# Patient Record
Sex: Female | Born: 1985 | Race: White | Hispanic: No | Marital: Married | Smoking: Former smoker
Health system: Southern US, Community
[De-identification: ages and names within clinical notes are randomized; demographics above are authoritative.]

## PROBLEM LIST (undated history)

## (undated) ENCOUNTER — Inpatient Hospital Stay (HOSPITAL_COMMUNITY): Payer: Self-pay

## (undated) ENCOUNTER — Inpatient Hospital Stay: Payer: Self-pay

## (undated) DIAGNOSIS — Z789 Other specified health status: Secondary | ICD-10-CM

## (undated) DIAGNOSIS — N920 Excessive and frequent menstruation with regular cycle: Secondary | ICD-10-CM

## (undated) DIAGNOSIS — O139 Gestational [pregnancy-induced] hypertension without significant proteinuria, unspecified trimester: Secondary | ICD-10-CM

## (undated) DIAGNOSIS — F32A Depression, unspecified: Secondary | ICD-10-CM

## (undated) DIAGNOSIS — R519 Headache, unspecified: Secondary | ICD-10-CM

## (undated) DIAGNOSIS — F419 Anxiety disorder, unspecified: Secondary | ICD-10-CM

## (undated) DIAGNOSIS — G43909 Migraine, unspecified, not intractable, without status migrainosus: Secondary | ICD-10-CM

## (undated) DIAGNOSIS — O209 Hemorrhage in early pregnancy, unspecified: Secondary | ICD-10-CM

## (undated) DIAGNOSIS — K649 Unspecified hemorrhoids: Secondary | ICD-10-CM

## (undated) DIAGNOSIS — R51 Headache: Secondary | ICD-10-CM

## (undated) DIAGNOSIS — R87629 Unspecified abnormal cytological findings in specimens from vagina: Secondary | ICD-10-CM

## (undated) HISTORY — DX: Depression, unspecified: F32.A

## (undated) HISTORY — PX: NO PAST SURGERIES: SHX2092

## (undated) HISTORY — DX: Headache, unspecified: R51.9

## (undated) HISTORY — DX: Anxiety disorder, unspecified: F41.9

## (undated) HISTORY — DX: Migraine, unspecified, not intractable, without status migrainosus: G43.909

## (undated) HISTORY — DX: Unspecified hemorrhoids: K64.9

## (undated) HISTORY — DX: Gestational (pregnancy-induced) hypertension without significant proteinuria, unspecified trimester: O13.9

## (undated) HISTORY — DX: Unspecified abnormal cytological findings in specimens from vagina: R87.629

## (undated) HISTORY — DX: Headache: R51

---
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
## 2011-08-15 NOTE — L&D Delivery Note (Signed)
Delivery Note At 6:36 AM a viable female was delivered via Vaginal, Spontaneous Delivery (Presentation: ; Occiput Anterior).  APGAR: 9, 9; weight .   Placenta status: Intact, w/ maternal valsalva and after pitocin infusion. Spontaneous.  Cord: 3 vessels with the following complications: None.  Cord pH: pending, drawn due to blood tinged amniotic fluid and hypertension, ? Mild abruption  Anesthesia: Epidural  Episiotomy: None Lacerations: 2nd degree;Perineal Suture Repair: 2.0 3.0 vicryl rapide, deeper layer of 2-0, 3 single figure of 8 sutures. Standard repair followed w/ 3.0. Est. Blood Loss (mL): 350  Mom to postpartum.  Baby to nursery-stable.  Gini Caputo,[place] A. 06/27/2012, 7:14 AM

## 2011-10-06 LAB — OB RESULTS CONSOLE GC/CHLAMYDIA: Gonorrhea: NEGATIVE

## 2011-11-16 LAB — OB RESULTS CONSOLE ABO/RH

## 2011-11-16 LAB — OB RESULTS CONSOLE ANTIBODY SCREEN: Antibody Screen: NEGATIVE

## 2012-05-20 LAB — OB RESULTS CONSOLE GBS: GBS: NEGATIVE

## 2012-06-26 ENCOUNTER — Encounter (HOSPITAL_COMMUNITY): Payer: Self-pay | Admitting: *Deleted

## 2012-06-26 ENCOUNTER — Inpatient Hospital Stay (HOSPITAL_COMMUNITY)
Admission: AD | Admit: 2012-06-26 | Discharge: 2012-06-29 | DRG: 372 | Disposition: A | Discharge status: Home / Self Care | Payer: BC Managed Care – PPO | Source: Ambulatory Visit | Attending: Obstetrics | Admitting: Obstetrics

## 2012-06-26 DIAGNOSIS — O139 Gestational [pregnancy-induced] hypertension without significant proteinuria, unspecified trimester: Secondary | ICD-10-CM | POA: Diagnosis present

## 2012-06-26 DIAGNOSIS — D62 Acute posthemorrhagic anemia: Secondary | ICD-10-CM | POA: Diagnosis not present

## 2012-06-26 DIAGNOSIS — O9903 Anemia complicating the puerperium: Secondary | ICD-10-CM | POA: Diagnosis not present

## 2012-06-26 HISTORY — DX: Other specified health status: Z78.9

## 2012-06-26 LAB — CBC WITH DIFFERENTIAL/PLATELET
Basophils Absolute: 0 10*3/uL (ref 0.0–0.1)
Basophils Relative: 0 % (ref 0–1)
HCT: 33.5 % — ABNORMAL LOW (ref 36.0–46.0)
Hemoglobin: 11.2 g/dL — ABNORMAL LOW (ref 12.0–15.0)
Lymphocytes Relative: 23 % (ref 12–46)
MCHC: 33.4 g/dL (ref 30.0–36.0)
Monocytes Absolute: 0.6 10*3/uL (ref 0.1–1.0)
Monocytes Relative: 5 % (ref 3–12)
Neutro Abs: 8.1 10*3/uL — ABNORMAL HIGH (ref 1.7–7.7)
Neutrophils Relative %: 72 % (ref 43–77)
WBC: 11.3 10*3/uL — ABNORMAL HIGH (ref 4.0–10.5)

## 2012-06-26 LAB — COMPREHENSIVE METABOLIC PANEL
ALT: 16 U/L (ref 0–35)
AST: 24 U/L (ref 0–37)
Albumin: 2.4 g/dL — ABNORMAL LOW (ref 3.5–5.2)
Alkaline Phosphatase: 155 U/L — ABNORMAL HIGH (ref 39–117)
Chloride: 102 mEq/L (ref 96–112)
Potassium: 4.1 mEq/L (ref 3.5–5.1)
Sodium: 135 mEq/L (ref 135–145)
Total Bilirubin: 0.2 mg/dL — ABNORMAL LOW (ref 0.3–1.2)

## 2012-06-26 LAB — CBC
HCT: 35.7 % — ABNORMAL LOW (ref 36.0–46.0)
Hemoglobin: 11.8 g/dL — ABNORMAL LOW (ref 12.0–15.0)
RBC: 4.23 MIL/uL (ref 3.87–5.11)

## 2012-06-26 LAB — URIC ACID: Uric Acid, Serum: 7.5 mg/dL — ABNORMAL HIGH (ref 2.4–7.0)

## 2012-06-26 MED ORDER — OXYTOCIN 40 UNITS IN LACTATED RINGERS INFUSION - SIMPLE MED
1.0000 m[IU]/min | INTRAVENOUS | Status: DC
  Administered 2012-06-26: 2 m[IU]/min via INTRAVENOUS
  Administered 2012-06-26: 4 m[IU]/min via INTRAVENOUS
  Administered 2012-06-27 (×2): 6 m[IU]/min via INTRAVENOUS
  Filled 2012-06-26: qty 1000

## 2012-06-26 MED ORDER — IBUPROFEN 600 MG PO TABS
600.0000 mg | ORAL_TABLET | Freq: Four times a day (QID) | ORAL | Status: DC | PRN
  Administered 2012-06-27: 600 mg via ORAL
  Filled 2012-06-26: qty 1

## 2012-06-26 MED ORDER — LACTATED RINGERS IV SOLN
500.0000 mL | INTRAVENOUS | Status: DC | PRN
  Administered 2012-06-27: 1000 mL via INTRAVENOUS

## 2012-06-26 MED ORDER — LABETALOL HCL 100 MG PO TABS
100.0000 mg | ORAL_TABLET | Freq: Once | ORAL | Status: AC
  Administered 2012-06-26: 100 mg via ORAL
  Filled 2012-06-26: qty 1

## 2012-06-26 MED ORDER — OXYTOCIN BOLUS FROM INFUSION: 500.0000 mL | INTRAVENOUS | Status: DC

## 2012-06-26 MED ORDER — LIDOCAINE HCL (PF) 1 % IJ SOLN
30.0000 mL | INTRAMUSCULAR | Status: DC | PRN
  Administered 2012-06-27: 30 mL via SUBCUTANEOUS
  Filled 2012-06-26: qty 30

## 2012-06-26 MED ORDER — MISOPROSTOL 25 MCG QUARTER TABLET
25.0000 ug | ORAL_TABLET | Freq: Once | ORAL | Status: AC
  Administered 2012-06-26: 25 ug via VAGINAL
  Filled 2012-06-26: qty 0.25

## 2012-06-26 MED ORDER — TERBUTALINE SULFATE 1 MG/ML IJ SOLN: 0.2500 mg | Freq: Once | INTRAMUSCULAR | Status: AC | PRN

## 2012-06-26 MED ORDER — LABETALOL HCL 5 MG/ML IV SOLN
10.0000 mg | Freq: Once | INTRAVENOUS | Status: AC
  Administered 2012-06-26: 10 mg via INTRAVENOUS
  Filled 2012-06-26: qty 4

## 2012-06-26 MED ORDER — LACTATED RINGERS IV SOLN
INTRAVENOUS | Status: DC
  Administered 2012-06-26: 15:00:00 via INTRAVENOUS

## 2012-06-26 MED ORDER — FLEET ENEMA 7-19 GM/118ML RE ENEM: 1.0000 | ENEMA | Freq: Every day | RECTAL | Status: DC | PRN

## 2012-06-26 MED ORDER — ONDANSETRON HCL 4 MG/2ML IJ SOLN: 4.0000 mg | Freq: Four times a day (QID) | INTRAMUSCULAR | Status: DC | PRN

## 2012-06-26 MED ORDER — ACETAMINOPHEN 325 MG PO TABS: 650.0000 mg | ORAL_TABLET | ORAL | Status: DC | PRN

## 2012-06-26 MED ORDER — OXYCODONE-ACETAMINOPHEN 5-325 MG PO TABS: 1.0000 | ORAL_TABLET | ORAL | Status: DC | PRN

## 2012-06-26 MED ORDER — OXYTOCIN 40 UNITS IN LACTATED RINGERS INFUSION - SIMPLE MED
62.5000 mL/h | INTRAVENOUS | Status: DC
  Administered 2012-06-27: 62.5 mL/h via INTRAVENOUS

## 2012-06-26 MED ORDER — CITRIC ACID-SODIUM CITRATE 334-500 MG/5ML PO SOLN: 30.0000 mL | ORAL | Status: DC | PRN

## 2012-06-26 NOTE — Progress Notes (Signed)
Late entry. Pt seen at 9:30 pm  Notes no HA/ scotomata but did have some earlier when bp elevated. Received 10mg  IV and 100mg  po labetalol w/ improvement in bp. No VB. Some cramping but no significant contractions.  O: Toco: none, some irritibility GU: foley in, at distal edge of cvx, close to coming out. cvx 3 cm, soft, 20% FH: reactive FH, no decels, 10 beat variability Gen: well appearing, no distress, no RUQ pain  A/P: Gest htn, IOL - gest htn, repeat another 100mg  po labetalol, may need to cont 200mg  po q 8hrs. Watch for acute elevations and treat w/ IV push as needed. Repeat PIH labs at 11p (results pending now) - FWB. Reactive testing - IOL. Slow but appropriate progress. Start pitocin when finished w/ 2nd cytotec, pt now ripe.   Mazzie Brodrick,[place] A. 06/26/2012 11:46 PM

## 2012-06-26 NOTE — H&P (Signed)
Jacqueline Barber is a 26 y.o. G1P0000 at [redacted]w[redacted]d presenting for a labor due to gestational hypertension. Pt notes rare contractions  . Good fetal movement, No vaginal bleeding, not leaking fluid. Patient notes increased thirst however no scotomata, no right upper quadrant pain, no headache. Patient seen in the office yesterday with 500 mg her urine on urine protein tip in was in the process of a 24-hour urine collection when patient asked for induction of labor due to feeling not quite right. Patient did have labs for preeclampsia drawn yesterday which were notable for a uric acid at 7.2. CBC creatinine and liver function tests were all within normal limits.  [facility] first trimester GBS negative -Prior TCA to cervix, no LEEP, followup Paps normal - Gestational hypertension. Elevated blood pressures noted at 33 weeks patient had labs which remained reassuring in overall exam status reassuring. -Status post Tdap  Prenatal Transfer Tool  Maternal Diabetes: No Genetic Screening: Normal Maternal Ultrasounds/Referrals: Normal Fetal Ultrasounds or other Referrals:  None Maternal Substance Abuse:  No Significant Maternal Medications:  Meds include: Other:  Zantac, Valtrex, prenatal vitamin, Fioricet  Significant Maternal Lab Results: Lab values include: Group B Strep negative, elevated uric acid     OB History    Grav Para Term Preterm Abortions TAB SAB Ect Mult Living   1 0 0 0 0 0 0 0 0 0      Past Medical History  Diagnosis Date  . No pertinent past medical history    History reviewed. No pertinent past surgical history. Family History: family history includes Cancer in her brother; Diabetes in her maternal grandfather; Hyperlipidemia in her father; and Hypertension in her father and mother. Social History:  reports that she has never smoked. She does not have any smokeless tobacco history on file. She reports that she does not drink alcohol or use illicit  drugs.  Review of Systems - Negative except Anxiety due to elevated blood pressure   Dilation: Closed Effacement (%): Thick Station: -3 Exam by:: J.Thornton, RN Blood pressure 142/82, pulse 89, temperature 98 F (36.7 C), temperature source Oral, resp. rate 18, height 5\' 2"  (1.575 m), weight 83.915 kg (185 lb), last menstrual period 09/16/2011.  Physical Exam:  Gen: well appearing, no distress, slightly anxious CV: RRR Pulm: CTAB Back: no CVAT Abd: gravid, NT, no RUQ pain LE: No  edema, equal bilaterally, non-tender Toco: Rare contractions and irritability  FH: baseline 150s, accelerations present, no deceleratons, 10 beat variability  Cervical exam by me fingertip long posterior vertex. Decision made to place a cervical Foley bulb. Informed consent obtained. The patient placed in the dorsal supine lithotomy position, sterile speculum was inserted into the vagina, Betadine x3 was used to cleanse the cervix. A Foley catheter was threaded through the cervix with the use of ring forceps. Once at the internal os the Foley bulb was inflated with 60 cc of normal saline. Small amount of bleeding was noted. Foley bulb placed on tension. Patient tolerated the procedure well.   Prenatal labs: ABO, Rh: A/Positive/-- (04/04 0000) Antibody: Negative (04/04 0000) Rubella:  Immune RPR: Nonreactive (04/04 0000)  HBsAg: Negative (04/04 0000)  HIV:   Negative  GBS: Negative (10/07 0000)  1 hr Glucola 73  Genetic screening normal  Anatomy US normal   Assessment/Plan: 26 y.o. G1P0000 at [redacted]w[redacted]d - Gestational hypertension. Repeat labs for preeclampsia R.  significant for a uric acid at 7.5 otherwise labs and exam are normal. Given past EDC and  elevated blood pressures will move towards delivery with induction of labor, - Induction of labor. Cytotec and cervical Foley bulb now. We'll plan Pitocin when patient more favorable - Pain. Planning epidural - Fetal well being. Reactive fetal  testing   Jacqueline Barber,[place] A. 06/26/2012, 4:07 PM

## 2012-06-27 ENCOUNTER — Encounter (HOSPITAL_COMMUNITY): Payer: Self-pay | Admitting: Anesthesiology

## 2012-06-27 ENCOUNTER — Encounter (HOSPITAL_COMMUNITY): Payer: Self-pay

## 2012-06-27 ENCOUNTER — Observation Stay (HOSPITAL_COMMUNITY): Payer: BC Managed Care – PPO | Admitting: Anesthesiology

## 2012-06-27 LAB — CBC
MCV: 84 fL (ref 78.0–100.0)
Platelets: 166 10*3/uL (ref 150–400)
RBC: 3.99 MIL/uL (ref 3.87–5.11)
WBC: 16.1 10*3/uL — ABNORMAL HIGH (ref 4.0–10.5)

## 2012-06-27 LAB — COMPREHENSIVE METABOLIC PANEL
AST: 23 U/L (ref 0–37)
Albumin: 2.1 g/dL — ABNORMAL LOW (ref 3.5–5.2)
Alkaline Phosphatase: 137 U/L — ABNORMAL HIGH (ref 39–117)
CO2: 19 mEq/L (ref 19–32)
Chloride: 102 mEq/L (ref 96–112)
Creatinine, Ser: 0.66 mg/dL (ref 0.50–1.10)
GFR calc non Af Amer: >90 mL/min (ref >90)
Potassium: 3.9 mEq/L (ref 3.5–5.1)
Total Bilirubin: 0.1 mg/dL — ABNORMAL LOW (ref 0.3–1.2)

## 2012-06-27 LAB — RPR: RPR Ser Ql: NONREACTIVE

## 2012-06-27 MED ORDER — SIMETHICONE 80 MG PO CHEW: 80.0000 mg | CHEWABLE_TABLET | ORAL | Status: DC | PRN

## 2012-06-27 MED ORDER — PHENYLEPHRINE 40 MCG/ML (10ML) SYRINGE FOR IV PUSH (FOR BLOOD PRESSURE SUPPORT)
80.0000 ug | PREFILLED_SYRINGE | INTRAVENOUS | Status: DC | PRN
  Filled 2012-06-27: qty 5

## 2012-06-27 MED ORDER — FENTANYL 2.5 MCG/ML BUPIVACAINE 1/10 % EPIDURAL INFUSION (WH - ANES)
INTRAMUSCULAR | Status: DC | PRN
  Administered 2012-06-27: 14 mL/h via EPIDURAL

## 2012-06-27 MED ORDER — IBUPROFEN 600 MG PO TABS
600.0000 mg | ORAL_TABLET | Freq: Four times a day (QID) | ORAL | Status: DC
  Administered 2012-06-27 – 2012-06-29 (×8): 600 mg via ORAL
  Filled 2012-06-27 (×8): qty 1

## 2012-06-27 MED ORDER — LANOLIN HYDROUS EX OINT: TOPICAL_OINTMENT | CUTANEOUS | Status: DC | PRN

## 2012-06-27 MED ORDER — DIPHENHYDRAMINE HCL 50 MG/ML IJ SOLN: 12.5000 mg | INTRAMUSCULAR | Status: DC | PRN

## 2012-06-27 MED ORDER — BISACODYL 10 MG RE SUPP: 10.0000 mg | Freq: Every day | RECTAL | Status: DC | PRN

## 2012-06-27 MED ORDER — PHENYLEPHRINE 40 MCG/ML (10ML) SYRINGE FOR IV PUSH (FOR BLOOD PRESSURE SUPPORT): 80.0000 ug | PREFILLED_SYRINGE | INTRAVENOUS | Status: DC | PRN

## 2012-06-27 MED ORDER — PRENATAL MULTIVITAMIN CH
1.0000 | ORAL_TABLET | Freq: Every day | ORAL | Status: DC
  Administered 2012-06-27 – 2012-06-29 (×3): 1 via ORAL
  Filled 2012-06-27 (×3): qty 1

## 2012-06-27 MED ORDER — BENZOCAINE-MENTHOL 20-0.5 % EX AERO
1.0000 "application " | INHALATION_SPRAY | CUTANEOUS | Status: DC | PRN
  Administered 2012-06-27: 1 via TOPICAL
  Filled 2012-06-27: qty 56

## 2012-06-27 MED ORDER — ONDANSETRON HCL 4 MG/2ML IJ SOLN: 4.0000 mg | INTRAMUSCULAR | Status: DC | PRN

## 2012-06-27 MED ORDER — ZOLPIDEM TARTRATE 5 MG PO TABS: 5.0000 mg | ORAL_TABLET | Freq: Every evening | ORAL | Status: DC | PRN

## 2012-06-27 MED ORDER — TETANUS-DIPHTH-ACELL PERTUSSIS 5-2.5-18.5 LF-MCG/0.5 IM SUSP: 0.5000 mL | Freq: Once | INTRAMUSCULAR | Status: DC

## 2012-06-27 MED ORDER — DOCUSATE SODIUM 100 MG PO CAPS
100.0000 mg | ORAL_CAPSULE | Freq: Two times a day (BID) | ORAL | Status: DC
  Administered 2012-06-27 – 2012-06-29 (×5): 100 mg via ORAL
  Filled 2012-06-27 (×5): qty 1

## 2012-06-27 MED ORDER — ONDANSETRON HCL 4 MG PO TABS: 4.0000 mg | ORAL_TABLET | ORAL | Status: DC | PRN

## 2012-06-27 MED ORDER — EPHEDRINE 5 MG/ML INJ: 10.0000 mg | INTRAVENOUS | Status: DC | PRN

## 2012-06-27 MED ORDER — SODIUM BICARBONATE 8.4 % IV SOLN
INTRAVENOUS | Status: DC | PRN
  Administered 2012-06-27: 5 mL via EPIDURAL

## 2012-06-27 MED ORDER — SENNOSIDES-DOCUSATE SODIUM 8.6-50 MG PO TABS
2.0000 | ORAL_TABLET | Freq: Every day | ORAL | Status: DC
  Administered 2012-06-27 – 2012-06-28 (×2): 2 via ORAL

## 2012-06-27 MED ORDER — FLEET ENEMA 7-19 GM/118ML RE ENEM: 1.0000 | ENEMA | Freq: Every day | RECTAL | Status: DC | PRN

## 2012-06-27 MED ORDER — DIBUCAINE 1 % RE OINT: 1.0000 "application " | TOPICAL_OINTMENT | RECTAL | Status: DC | PRN

## 2012-06-27 MED ORDER — DIPHENHYDRAMINE HCL 25 MG PO CAPS: 25.0000 mg | ORAL_CAPSULE | Freq: Four times a day (QID) | ORAL | Status: DC | PRN

## 2012-06-27 MED ORDER — FENTANYL 2.5 MCG/ML BUPIVACAINE 1/10 % EPIDURAL INFUSION (WH - ANES)
14.0000 mL/h | INTRAMUSCULAR | Status: DC
  Filled 2012-06-27: qty 125

## 2012-06-27 MED ORDER — OXYCODONE-ACETAMINOPHEN 5-325 MG PO TABS
1.0000 | ORAL_TABLET | ORAL | Status: DC | PRN
  Administered 2012-06-27 – 2012-06-29 (×5): 1 via ORAL
  Filled 2012-06-27 (×5): qty 1

## 2012-06-27 MED ORDER — LACTATED RINGERS IV SOLN: 500.0000 mL | Freq: Once | INTRAVENOUS | Status: DC

## 2012-06-27 MED ORDER — WITCH HAZEL-GLYCERIN EX PADS: 1.0000 "application " | MEDICATED_PAD | CUTANEOUS | Status: DC | PRN

## 2012-06-27 MED ORDER — EPHEDRINE 5 MG/ML INJ
10.0000 mg | INTRAVENOUS | Status: DC | PRN
  Filled 2012-06-27: qty 4

## 2012-06-27 NOTE — Anesthesia Preprocedure Evaluation (Signed)

## 2012-06-27 NOTE — Anesthesia Procedure Notes (Signed)

## 2012-06-27 NOTE — Anesthesia Postprocedure Evaluation (Signed)
  Anesthesia Post-op Note  Patient: Jacqueline Barber  Procedure(s) Performed: * No procedures listed *  Patient Location: Mother/Baby  Anesthesia Type:Epidural  Level of Consciousness: awake  Airway and Oxygen Therapy: Patient Spontanous Breathing  Post-op Pain: mild  Post-op Assessment: Patient's Cardiovascular Status Stable and Respiratory Function Stable  Post-op Vital Signs: stable  Complications: No apparent anesthesia complications

## 2012-06-28 ENCOUNTER — Encounter (HOSPITAL_COMMUNITY): Payer: Self-pay | Admitting: Obstetrics and Gynecology

## 2012-06-28 LAB — COMPREHENSIVE METABOLIC PANEL
ALT: 12 U/L (ref 0–35)
Alkaline Phosphatase: 103 U/L (ref 39–117)
BUN: 10 mg/dL (ref 6–23)
CO2: 24 mEq/L (ref 19–32)
Chloride: 103 mEq/L (ref 96–112)
GFR calc Af Amer: >90 mL/min (ref >90)
GFR calc non Af Amer: >90 mL/min (ref >90)
Glucose, Bld: 67 mg/dL — ABNORMAL LOW (ref 70–99)
Potassium: 4.2 mEq/L (ref 3.5–5.1)
Total Bilirubin: 0.1 mg/dL — ABNORMAL LOW (ref 0.3–1.2)

## 2012-06-28 LAB — CBC
HCT: 29.3 % — ABNORMAL LOW (ref 36.0–46.0)
Hemoglobin: 9.7 g/dL — ABNORMAL LOW (ref 12.0–15.0)
MCHC: 33.1 g/dL (ref 30.0–36.0)
RBC: 3.48 MIL/uL — ABNORMAL LOW (ref 3.87–5.11)
WBC: 13.7 10*3/uL — ABNORMAL HIGH (ref 4.0–10.5)

## 2012-06-28 LAB — URIC ACID: Uric Acid, Serum: 6.8 mg/dL (ref 2.4–7.0)

## 2012-06-28 NOTE — Progress Notes (Signed)
Patient ID: Jacqueline Barber, female   DOB: 1986-05-29, 26 y.o.   MRN: [id number] PPD # 1  Subjective: Pt reports feeling tired, but well/ Moderate pain control with ibuprofen.  Tried percocet x 1 during the night, without sig relief Tolerating po/ Voiding without problems/ No n/v Bleeding is moderate Newborn info:  Information for the patient's newborn:  [name], Boardley [[id number]]  female  / circ desired; most likely today/ Feeding: breast   Objective:  VS: Blood pressure 116/76, pulse 78, temperature 98 F (36.7 C), temperature source Oral, resp. rate 18.    Basename 06/28/12 0515 06/27/12 0743  WBC 13.7* 16.1*  HGB 9.7* 11.2*  HCT 29.3* 33.5*  PLT 154 166    Blood type: A/Positive/-- (04/04 0000) Rubella: Immune (04/04 0000)    Physical Exam:  General: A & O x 3  alert, cooperative and no distress CV: Regular rate and rhythm Resp: clear Abdomen: soft, nontender, normal bowel sounds Uterine Fundus: firm, below umbilicus, nontender Perineum: mild edema, well approximated Lochia: moderate Ext: edema +2 pedal and pretib and Homans sign is negative, no sign of DVT   A/P: PPD # 1/ G1P1001/ S/P:spontaneous vaginal delivery with 2nd deg repair Mild ABL anemia; will add fe supp at d/c Doing well; urged to improve pain control and increase mobility.  Try percocet again during the day Continue routine post partum orders Anticipate D/C home in AM    Demetrius Revel, MSN, [facility] 06/28/2012, 9:24 AM

## 2012-06-29 MED ORDER — POLYSACCHARIDE IRON COMPLEX 150 MG PO CAPS
150.0000 mg | ORAL_CAPSULE | Freq: Every day | ORAL | Status: DC
  Filled 2012-06-29 (×2): qty 1

## 2012-06-29 MED ORDER — IBUPROFEN 600 MG PO TABS: 600.0000 mg | ORAL_TABLET | Freq: Four times a day (QID) | ORAL | Status: DC

## 2012-06-29 MED ORDER — DSS 100 MG PO CAPS: 100.0000 mg | ORAL_CAPSULE | Freq: Two times a day (BID) | ORAL | Status: DC

## 2012-06-29 MED ORDER — OXYCODONE-ACETAMINOPHEN 5-325 MG PO TABS: 1.0000 | ORAL_TABLET | ORAL | Status: DC | PRN

## 2012-06-29 MED ORDER — POLYSACCHARIDE IRON COMPLEX 150 MG PO CAPS: 150.0000 mg | ORAL_CAPSULE | Freq: Every day | ORAL | Status: DC

## 2012-06-29 NOTE — Discharge Summary (Signed)
Obstetric Discharge Summary  Reason for Admission: onset of labor Prenatal Procedures: none Intrapartum Procedures: spontaneous vaginal delivery Postpartum Procedures: none Complications-Operative and Postpartum: 2nd degree perineal laceration Hemoglobin  Date Value Range Status  06/28/2012 9.7* 12.0 - 15.0 g/dL Final     HCT  Date Value Range Status  06/28/2012 29.3* 36.0 - 46.0 % Final    Physical Exam:  General: alert, cooperative and no distress Lochia: appropriate Uterine Fundus: firm Incision: healing well DVT Evaluation: No evidence of DVT seen on physical exam.  Discharge Diagnoses: Term Pregnancy-delivered and ABL anemia  Discharge Information: Date: 06/29/2012 Activity: pelvic rest Diet: routine Medications: PNV, Ibuprofen, Colace, Iron and Percocet Condition: stable Instructions: refer to practice specific booklet Discharge to: home Follow-up Information    Follow up with [facility] A[facility] MD. Schedule an appointment as soon as possible for a visit in 6 weeks.   Contact information:   [address] [place] [address] [phone]          Newborn Data: Live born female  Birth Weight: 7 lb 5.8 oz (3340 g) APGAR: 9, 9  Home with mother.  Marlinda Mike 06/29/2012, 9:42 AM

## 2012-06-29 NOTE — Progress Notes (Signed)
PPD 2 SVD  S:  Reports feeling better today             Tolerating po/ No nausea or vomiting             Bleeding is light             Pain controlled with motrin and percocet             Up ad lib / ambulatory  Newborn breast feeding  / Circumcision planned today prior to dc   O:               VS: BP 114/70  Pulse 74  Temp 97.8 F (36.6 C) (Oral)  Resp 18  Ht 5\' 2"  (1.575 m)  Wt 83.915 kg (185 lb)  BMI 33.84 kg/m2  SpO2 100%  LMP 09/16/2011  Breastfeeding? Unknown   LABS:  Basename 06/28/12 0515 06/27/12 0743  WBC 13.7* 16.1*  HGB 9.7* 11.2*  PLT 154 166                     Physical Exam:             Alert and oriented X3  Lungs: Clear and unlabored  Heart: regular rate and rhythm / no mumurs  Abdomen: soft, non-tender, non-distended              Fundus: firm, non-tender, U-1  Perineum: no edema  Lochia: light  Extremities: trace edema, no calf pain or tenderness    A: PPD # 2              ABL anemia  Doing well - stable status  P:  Routine post partum orders  Iron and colace at discharge             Newborn circ prior to dc today  Marlinda Mike CNM, MSN 06/29/2012, 8:53 AM

## 2012-07-01 ENCOUNTER — Telehealth (HOSPITAL_COMMUNITY): Payer: Self-pay | Admitting: *Deleted

## 2012-07-01 NOTE — Telephone Encounter (Signed)
Resolve episode 

## 2012-10-31 ENCOUNTER — Encounter (INDEPENDENT_AMBULATORY_CARE_PROVIDER_SITE_OTHER): Payer: Self-pay | Admitting: General Surgery

## 2012-10-31 ENCOUNTER — Ambulatory Visit (INDEPENDENT_AMBULATORY_CARE_PROVIDER_SITE_OTHER): Payer: BC Managed Care – PPO | Admitting: General Surgery

## 2012-10-31 VITALS — BP 120/80 | HR 64 | Temp 98.2°F | Resp 18 | Ht 62.0 in | Wt 152.8 lb

## 2012-10-31 DIAGNOSIS — K602 Anal fissure, unspecified: Secondary | ICD-10-CM | POA: Insufficient documentation

## 2012-10-31 MED ORDER — AMBULATORY NON FORMULARY MEDICATION: 1.0000 "application " | Freq: Two times a day (BID) | Status: DC

## 2012-10-31 NOTE — Progress Notes (Signed)
Patient ID: Jacqueline Barber, female   DOB: 08/19/1985, 27 y.o.   MRN: [id number]  Chief Complaint  Patient presents with  . Anal Fissure    HPI Jacqueline Barber is a 27 y.o. female.   HPI 27 yo WF referred by Dr Juluis Rainier for evaluation of anal pain. The patient states her problems began shortly after delivering her child in Fall 2013. She states that she first noticed some blood on the toilet paper as well as having pain when having a bowel movement. She described it as a sharp burning pain. She did have some discomfort that would last for short time. She was placed on stool softeners which did help. She ended up seeing her primary care physician who saw small tears as well as hemorrhoids. She states that she was placed on medication such as Proctofoam and the symptoms away; however the symptoms came back about a month later. She reports that she had some pain with defecation last week. She describes it as sharp pain. She has had some irregular pattern to her bowel habits over the past month or so. Typically she does have a bowel movement every day; however, recently there has been more irregularity to it. She states that she drinks plenty of water on a daily basis. She denies being a bathroom reader. She denies any perianal itching or burning. She denies any stool or urinary incontinence. She denies any stool caliber changes. She denies any weight loss.  Past Medical History  Diagnosis Date  . No pertinent past medical history   . Postpartum care following vaginal delivery (06/27/12) M 06/28/2012    History reviewed. No pertinent past surgical history.  Family History  Problem Relation Age of Onset  . Hypertension Mother   . Hypertension Father   . Hyperlipidemia Father   . Cancer Brother     Leukemia  . Diabetes Maternal Grandfather     Social History History  Substance Use Topics  . Smoking status: Former Smoker    Types: Cigarettes    Quit date: 10/13/2011  . Smokeless  tobacco: Not on file  . Alcohol Use: 1.8 oz/week    3 Glasses of wine per week     Comment: Weekly.    Allergies  Allergen Reactions  . Amoxicillin Rash  . Cephalexin Rash    Current Outpatient Prescriptions  Medication Sig Dispense Refill  . Prenatal Vit-Fe Fumarate-FA (PRENATAL MULTIVITAMIN) TABS Take 1 tablet by mouth daily.      . AMBULATORY NON FORMULARY MEDICATION Place 1 application rectally 2 (two) times daily. Medication Name: Nifedipine 2% ointment  30 g  2   No current facility-administered medications for this visit.    Review of Systems Review of Systems  Constitutional: Negative for fever, chills and unexpected weight change.  HENT: Negative for hearing loss, congestion, sore throat, trouble swallowing and voice change.   Eyes: Negative for visual disturbance.  Respiratory: Negative for cough and wheezing.   Cardiovascular: Negative for chest pain, palpitations and leg swelling.  Gastrointestinal: Positive for anal bleeding and rectal pain. Negative for nausea, vomiting, abdominal pain, diarrhea, blood in stool and abdominal distention.  Genitourinary: Negative for hematuria, vaginal bleeding and difficulty urinating.  Musculoskeletal: Negative for arthralgias.  Skin: Negative for rash and wound.  Neurological: Negative for seizures, syncope and headaches.  Hematological: Negative for adenopathy. Does not bruise/bleed easily.  Psychiatric/Behavioral: Negative for confusion.    Blood pressure 120/80, pulse 64, temperature 98.2 F (36.8 C), temperature source Temporal,  resp. rate 18, height 5\' 2"  (1.575 m), weight 152 lb 12.8 oz (69.31 kg).  Physical Exam Physical Exam  Vitals reviewed. Constitutional: She is oriented to person, place, and time. She appears well-developed and well-nourished. No distress.  HENT:  Head: Normocephalic and atraumatic.  Right Ear: External ear normal.  Left Ear: External ear normal.  Eyes: Conjunctivae are normal.  Neck: Normal  range of motion. Neck supple. No tracheal deviation present. No thyromegaly present.  Cardiovascular: Normal rate, regular rhythm and normal heart sounds.   Pulmonary/Chest: Effort normal and breath sounds normal. No respiratory distress. She has no wheezes.  Abdominal: Soft. She exhibits no distension. There is no tenderness. There is no rebound.  Tattoo RLQ  Genitourinary: Rectal exam shows fissure. Rectal exam shows no external hemorrhoid.  Posterior midline tear - anal fissure with sentinel pile. DRE and anoscopy deferred secondary to presence of fissure  Musculoskeletal: She exhibits no edema and no tenderness.  Lymphadenopathy:    She has no cervical adenopathy.  Neurological: She is alert and oriented to person, place, and time.  Skin: Skin is warm and dry. No rash noted. She is not diaphoretic. No erythema. No pallor.  Psychiatric: She has a normal mood and affect. Her behavior is normal. Judgment and thought content normal.    Data Reviewed Dr Nnodi's note 08/26/12  Assessment    Anal fissure     Plan    We discussed the etiology of anal fissures. The patient was given educational material as well as diagrams. We discussed nonoperative and operative management of anal fissures.  We discussed nonoperative management including correcting underlying bowel habits such as constipation, avoiding bathroom reading, avoiding straining with defecation. We also discussed the use of topical ointments such as nifedipine ointment. We also discussed the use of Botox injection.  With respect to surgical intervention, we discussed an anal sphincterotomy. I described how the procedure is performed. We also discussed the aftercare. We discussed the risk and benefits of surgery including but not limited to bleeding, infection, blood clot formation, general anesthesia risk, urinary retention, and the risk of incontinence. We discussed a 20-25% chance of incontinence to flatus, a 10-20% chance of  incontinence to liquid stool, and a 5-10% chance of incontinence to solid stool. I explained that the percentages I quoted are from the literature and not from my personal practice experience.  My recommendation was to start with non-operative management first.  The patient has elected to start with non-operative management first. She was given a prescription for nifedipine ointment to be used twice a day. She was instructed not to use any ointment that has hydrocortisone ointment in it. She was instructed to continue to drink plenty of water like she is doing. I also encouraged her to consider a fiber supplement such as Metamucil or Benefiber. I encouraged her to try to get 25 g a day of fiber in her diet and I reminded her that she should slowly increase that over time. We discussed sitz baths. Followup 8 weeks  Mary Sella. Andrey Campanile, MD, FACS General, Bariatric, & Minimally Invasive Surgery [facility], [place]          [facility] M 10/31/2012, 5:23 PM

## 2012-10-31 NOTE — Patient Instructions (Signed)
Stop using any ointments with hydrocortisone ointment (HC) Anal Fissure, Adult An anal fissure is a small tear or crack in the skin around the anus. Bleeding from a fissure usually stops on its own within a few minutes. However, bleeding will often reoccur with each bowel movement until the crack heals.  CAUSES   Passing large, hard stools.  Frequent diarrheal stools.  Constipation.  Inflammatory bowel disease (Crohn's disease or ulcerative colitis).  Infections.  Anal sex. SYMPTOMS   Small amounts of blood seen on your stools, on toilet paper, or in the toilet after a bowel movement.  Rectal bleeding.  Painful bowel movements.  Itching or irritation around the anus. DIAGNOSIS Your caregiver will examine the anal area. An anal fissure can usually be seen with careful inspection. A rectal exam may be performed and a short tube (anoscope) may be used to examine the anal canal. TREATMENT   You may be instructed to take fiber supplements. These supplements can soften your stool to help make bowel movements easier.  Sitz baths may be recommended to help heal the tear. Do not use soap in the sitz baths.  A medicated cream or ointment may be prescribed to lessen discomfort. HOME CARE INSTRUCTIONS   Maintain a diet high in fruits, whole grains, and vegetables. Avoid constipating foods like bananas and dairy products.  Take sitz baths as directed by your caregiver.  Drink enough fluids to keep your urine clear or pale yellow.  Only take over-the-counter or prescription medicines for pain, discomfort, or fever as directed by your caregiver. Do not take aspirin as this may increase bleeding.  Do not use ointments containing numbing medications (anesthetics) or hydrocortisone. They could slow healing. SEEK MEDICAL CARE IF:   Your fissure is not completely healed within 3 days.  You have further bleeding.  You have a fever.  You have diarrhea mixed with blood.  You have  pain.  Your problem is getting worse rather than better. MAKE SURE YOU:   Understand these instructions.  Will watch your condition.  Will get help right away if you are not doing well or get worse. Document Released: 07/31/2005 Document Revised: 10/23/2011 Document Reviewed: 01/15/2011 [facility] Patient Information 2013 [place], [place].  Fiber Content in Foods Drinking plenty of fluids and consuming foods high in fiber can help with constipation. See the list below for the fiber content of some common foods. Starches and Grains / Dietary Fiber (g)  Cheerios, 1 cup / 3 g  Kellogg's Corn Flakes, 1 cup / 0.7 g  Rice Krispies, 1  cup / 0.3 g  Quaker Oat Life Cereal,  cup / 2.1 g  Oatmeal, instant (cooked),  cup / 2 g  Kellogg's Frosted Mini Wheats, 1 cup / 5.1 g  Rice, brown, long-grain (cooked), 1 cup / 3.5 g  Rice, white, long-grain (cooked), 1 cup / 0.6 g  Macaroni, cooked, enriched, 1 cup / 2.5 g Legumes / Dietary Fiber (g)  Beans, baked, canned, plain or vegetarian,  cup / 5.2 g  Beans, kidney, canned,  cup / 6.8 g  Beans, pinto, dried (cooked),  cup / 7.7 g  Beans, pinto, canned,  cup / 5.5 g Breads and Crackers / Dietary Fiber (g)  Graham crackers, plain or honey, 2 squares / 0.7 g  Saltine crackers, 3 squares / 0.3 g  Pretzels, plain, salted, 10 pieces / 1.8 g  Bread, whole-wheat, 1 slice / 1.9 g  Bread, white, 1 slice / 0.7 g  Bread,  raisin, 1 slice / 1.2 g  Bagel, plain, 3 oz / 2 g  Tortilla, flour, 1 oz / 0.9 g  Tortilla, corn, 1 small / 1.5 g  Bun, hamburger or hotdog, 1 small / 0.9 g Fruits / Dietary Fiber (g)  Apple, raw with skin, 1 medium / 4.4 g  Applesauce, sweetened,  cup / 1.5 g  Banana,  medium / 1.5 g  Grapes, 10 grapes / 0.4 g  Orange, 1 small / 2.3 g  Raisin, 1.5 oz / 1.6 g  Melon, 1 cup / 1.4 g Vegetables / Dietary Fiber (g)  Green beans, canned,  cup / 1.3 g  Carrots (cooked),  cup / 2.3 g  Broccoli  (cooked),  cup / 2.8 g  Peas, frozen (cooked),  cup / 4.4 g  Potatoes, mashed,  cup / 1.6 g  Lettuce, 1 cup / 0.5 g  Corn, canned,  cup / 1.6 g  Tomato,  cup / 1.1 g Document Released: 12/17/2006 Document Revised: 10/23/2011 Document Reviewed: 02/11/2007 [facility] Patient Information 2013 [place], [place].

## 2012-12-25 ENCOUNTER — Encounter (INDEPENDENT_AMBULATORY_CARE_PROVIDER_SITE_OTHER): Payer: Self-pay | Admitting: General Surgery

## 2012-12-25 ENCOUNTER — Ambulatory Visit (INDEPENDENT_AMBULATORY_CARE_PROVIDER_SITE_OTHER): Payer: BC Managed Care – PPO | Admitting: General Surgery

## 2012-12-25 VITALS — BP 108/64 | HR 65 | Temp 97.6°F | Resp 14 | Ht 62.0 in | Wt 147.4 lb

## 2012-12-25 DIAGNOSIS — K602 Anal fissure, unspecified: Secondary | ICD-10-CM

## 2012-12-25 NOTE — Patient Instructions (Addendum)
Call if symptoms persist or if you decide to proceed with surgery  Anal Fissure, Adult An anal fissure is a small tear or crack in the skin around the anus. Bleeding from a fissure usually stops on its own within a few minutes. However, bleeding will often reoccur with each bowel movement until the crack heals.  CAUSES   Passing large, hard stools.  Frequent diarrheal stools.  Constipation.  Inflammatory bowel disease (Crohn's disease or ulcerative colitis).  Infections.  Anal sex. SYMPTOMS   Small amounts of blood seen on your stools, on toilet paper, or in the toilet after a bowel movement.  Rectal bleeding.  Painful bowel movements.  Itching or irritation around the anus. DIAGNOSIS Your caregiver will examine the anal area. An anal fissure can usually be seen with careful inspection. A rectal exam may be performed and a short tube (anoscope) may be used to examine the anal canal. TREATMENT   You may be instructed to take fiber supplements. These supplements can soften your stool to help make bowel movements easier.  Sitz baths may be recommended to help heal the tear. Do not use soap in the sitz baths.  A medicated cream or ointment may be prescribed to lessen discomfort. HOME CARE INSTRUCTIONS   Maintain a diet high in fruits, whole grains, and vegetables. Avoid constipating foods like bananas and dairy products.  Take sitz baths as directed by your caregiver.  Drink enough fluids to keep your urine clear or pale yellow.  Only take over-the-counter or prescription medicines for pain, discomfort, or fever as directed by your caregiver. Do not take aspirin as this may increase bleeding.  Do not use ointments containing numbing medications (anesthetics) or hydrocortisone. They could slow healing. SEEK MEDICAL CARE IF:   Your fissure is not completely healed within 3 days.  You have further bleeding.  You have a fever.  You have diarrhea mixed with blood.  You  have pain.  Your problem is getting worse rather than better. MAKE SURE YOU:   Understand these instructions.  Will watch your condition.  Will get help right away if you are not doing well or get worse. Document Released: 07/31/2005 Document Revised: 10/23/2011 Document Reviewed: 01/15/2011 [facility] Patient Information 2013 [place], [place].

## 2012-12-25 NOTE — Progress Notes (Signed)
Subjective:     Patient ID: Garner Nash, female   DOB: February 13, 1986, 27 y.o.   MRN: [id number]  HPI 27 year old Caucasian female comes in for followup regarding her anal fissure. I initially saw her on March 20. She states that she was doing well until about 2 weeks ago when she had recurrent pain. It is intermittent it is not with every bowel movement. She states that she is using the nifedipine ointment twice a day. She is also taking supplemental fiber. She is drinking 6 glasses of water a day. When she does have the discomfort it as a 2/10. The discomfort will generally last for about an hour at a time. She will have some occasional spotting on toilet Tissue. Her brother is scheduled to have upcoming spine surgery for removal of her tumor  PMHx, PSHx, SOCHx, FAMHx, ALL reviewed  Review of Systems 8 point ROS performed and negative except for HPI    Objective:   Physical Exam BP 108/64  Pulse 65  Temp(Src) 97.6 F (36.4 C) (Temporal)  Resp 14  Ht 5\' 2"  (1.575 m)  Wt 147 lb 6.4 oz (66.86 kg)  BMI 26.95 kg/m2  SpO2 98% Alert, nad,  Soft, nt, nd MAE, nonfocal Rectal - visual inspection only secondary to presence of fissure; tear in posterior midline with small sentinel pile    Assessment:     Chronic anal fissure     Plan:     We also discussed the use of topical ointments such as nifedipine ointment. We also discussed the use of Botox injection.  With respect to surgical intervention, we discussed an anal sphincterotomy. I described how the procedure is performed. We also discussed the aftercare. We discussed the risk and benefits of surgery including but not limited to bleeding, infection, blood clot formation, general anesthesia risk, urinary retention, and the risk of incontinence. We discussed a 20-25% chance of incontinence to flatus, a 10-20% chance of incontinence to liquid stool, and a 5-10% chance of incontinence to solid stool. I explained that the percentages I quoted  are from the literature and not from my personal practice experience.  Right now she would like to continue nonoperative management. I think that is completely reasonable since her discomfort is mild. She was instructed to continue to use the nifedipine ointment twice a day as well as supplemental fiber. We discussed the importance of having daily soft bowel movements. with her brother having upcoming major surgery she wants to focus on that for the immediate time being. She was instructed to call the office should she change her mind about surgery or for persistent symptoms.  Mary Sella. Andrey Campanile, MD, FACS General, Bariatric, & Minimally Invasive Surgery [facility], [place]

## 2014-06-15 ENCOUNTER — Encounter (INDEPENDENT_AMBULATORY_CARE_PROVIDER_SITE_OTHER): Payer: Self-pay | Admitting: General Surgery

## 2014-07-03 ENCOUNTER — Other Ambulatory Visit: Payer: Self-pay | Admitting: Otolaryngology

## 2014-08-11 ENCOUNTER — Encounter (HOSPITAL_BASED_OUTPATIENT_CLINIC_OR_DEPARTMENT_OTHER): Payer: Self-pay

## 2014-08-11 ENCOUNTER — Ambulatory Visit (HOSPITAL_BASED_OUTPATIENT_CLINIC_OR_DEPARTMENT_OTHER): Admit: 2014-08-11 | Payer: BC Managed Care – PPO | Admitting: Otolaryngology

## 2014-08-11 SURGERY — TONSILLECTOMY AND ADENOIDECTOMY
Anesthesia: General | Site: Mouth | Laterality: Bilateral

## 2014-08-22 ENCOUNTER — Inpatient Hospital Stay (HOSPITAL_COMMUNITY)
Admission: AD | Admit: 2014-08-22 | Discharge: 2014-08-22 | Disposition: A | Discharge status: Home / Self Care | Payer: BC Managed Care – PPO | Source: Ambulatory Visit | Attending: Obstetrics & Gynecology | Admitting: Obstetrics & Gynecology

## 2014-08-22 ENCOUNTER — Encounter (HOSPITAL_COMMUNITY): Payer: Self-pay

## 2014-08-22 DIAGNOSIS — R109 Unspecified abdominal pain: Secondary | ICD-10-CM | POA: Insufficient documentation

## 2014-08-22 DIAGNOSIS — O200 Threatened abortion: Secondary | ICD-10-CM

## 2014-08-22 DIAGNOSIS — Z87891 Personal history of nicotine dependence: Secondary | ICD-10-CM | POA: Diagnosis not present

## 2014-08-22 DIAGNOSIS — O209 Hemorrhage in early pregnancy, unspecified: Secondary | ICD-10-CM

## 2014-08-22 DIAGNOSIS — Z3A01 Less than 8 weeks gestation of pregnancy: Secondary | ICD-10-CM | POA: Diagnosis not present

## 2014-08-22 HISTORY — DX: Hemorrhage in early pregnancy, unspecified: O20.9

## 2014-08-22 LAB — URINALYSIS, ROUTINE W REFLEX MICROSCOPIC
Bilirubin Urine: NEGATIVE
Glucose, UA: NEGATIVE mg/dL
KETONES UR: NEGATIVE mg/dL
Leukocytes, UA: NEGATIVE
NITRITE: NEGATIVE
PROTEIN: NEGATIVE mg/dL
Specific Gravity, Urine: 1.025 (ref 1.005–1.030)
UROBILINOGEN UA: 0.2 mg/dL (ref 0.0–1.0)
pH: 6 (ref 5.0–8.0)

## 2014-08-22 LAB — CBC
HEMATOCRIT: 37.7 % (ref 36.0–46.0)
Hemoglobin: 12.8 g/dL (ref 12.0–15.0)
MCH: 30.6 pg (ref 26.0–34.0)
MCHC: 34 g/dL (ref 30.0–36.0)
MCV: 90.2 fL (ref 78.0–100.0)
Platelets: 242 10*3/uL (ref 150–400)
RBC: 4.18 MIL/uL (ref 3.87–5.11)
RDW: 13.3 % (ref 11.5–15.5)
WBC: 9.9 10*3/uL (ref 4.0–10.5)

## 2014-08-22 LAB — POCT PREGNANCY, URINE: Preg Test, Ur: POSITIVE — AB

## 2014-08-22 LAB — URINE MICROSCOPIC-ADD ON

## 2014-08-22 LAB — HCG, QUANTITATIVE, PREGNANCY: hCG, Beta Chain, Quant, S: 23 m[IU]/mL — ABNORMAL HIGH (ref <5)

## 2014-08-22 NOTE — Discharge Instructions (Signed)
Human Chorionic Gonadotropin (hCG) This is a test to confirm and monitor pregnancy or to diagnose trophoblastic disease or germ cell tumors. As early as 10 days after a missed menstrual period (some methods can detect hCG even earlier, at one week after conception) or if your caregiver thinks that your symptoms suggest ectopic pregnancy, a failing pregnancy, trophoblastic disease, or germ cell tumors. hCG is a protein produced in the placenta of a pregnant woman. A pregnancy test is a specific blood or urine test that can detect hCG and confirm pregnancy. This hormone is able to be detected 10 days after a missed menstrual period, the time period when the fertilized egg is implanted in the woman's uterus. With some methods, hCG can be detected even earlier, at one week after conception.  During the early weeks of pregnancy, hCG is important in maintaining function of the corpus luteum (the mass of cells that forms from a mature egg). Production of hCG increases steadily during the first trimester (8-10 weeks), peaking around the 10th week after the last menstrual cycle. Levels then fall slowly during the remainder of the pregnancy. hCG is no longer detectable within a few weeks after delivery. hCG is also produced by some germ cell tumors and increased levels are seen in trophoblastic disease. SAMPLE COLLECTION hCG is commonly detected in urine. The preferred specimen is a random urine sample collected first thing in the morning. hCG can also be measured in blood drawn from a vein in the arm. NORMAL FINDINGS Qualitative:negative in non-pregnant women; positive in pregnancy Quantitative:   Gestation less than 1 week: 5-50 Whole HCG (milli-international units/mL)  Gestation of 2 weeks: 50-500 Whole HCG (milli-international units/mL)  Gestation of 3 weeks: 100-10,000 Whole HCG (milli-international units/mL)  Gestation of 4 weeks: 1,000-30,000 Whole HCG (milli-international units/mL)  Gestation of 5  weeks 3,500-115,000 Whole HCG (milli-international units/mL)  Gestation of 6-8 weeks: 12,000-270,000 Whole HCG (milli-international units/mL)  Gestation of 12 weeks: 15,000-220,000 Whole HCG (milli-international units/mL)  Males and non-pregnant females: less than 5 Whole HCG (milli-international units/mL) Beta subunit: depends on the method and test used Ranges for normal findings may vary among different laboratories and hospitals. You should always check with your doctor after having lab work or other tests done to discuss the meaning of your test results and whether your values are considered within normal limits. MEANING OF TEST  Your caregiver will go over the test results with you and discuss the importance and meaning of your results, as well as treatment options and the need for additional tests if necessary. OBTAINING THE TEST RESULTS It is your responsibility to obtain your test results. Ask the lab or department performing the test when and how you will get your results. Document Released: 09/01/2004 Document Revised: 10/23/2011 Document Reviewed: 11/03/2013 [facility] Patient Information 2015 [place], [place]. This information is not intended to replace advice given to you by your health care provider. Make sure you discuss any questions you have with your health care provider.  Threatened Miscarriage A threatened miscarriage occurs when you have vaginal bleeding during your first 20 weeks of pregnancy but the pregnancy has not ended. If you have vaginal bleeding during this time, your health care provider will do tests to make sure you are still pregnant. If the tests show you are still pregnant and the developing baby (fetus) inside your womb (uterus) is still growing, your condition is considered a threatened miscarriage. A threatened miscarriage does not mean your pregnancy will end, but it does increase  the risk of losing your pregnancy (complete miscarriage). CAUSES  The cause of  a threatened miscarriage is usually not known. If you go on to have a complete miscarriage, the most common cause is an abnormal number of chromosomes in the developing baby. Chromosomes are the structures inside cells that hold all your genetic material. Some causes of vaginal bleeding that do not result in miscarriage include:  Having sex.  Having an infection.  Normal hormone changes of pregnancy.  Bleeding that occurs when an egg implants in your uterus. RISK FACTORS Risk factors for bleeding in early pregnancy include:  Obesity.  Smoking.  Drinking excessive amounts of alcohol or caffeine.  Recreational drug use. SIGNS AND SYMPTOMS  Light vaginal bleeding.  Mild abdominal pain or cramps. DIAGNOSIS  If you have bleeding with or without abdominal pain before 20 weeks of pregnancy, your health care provider will do tests to check whether you are still pregnant. One important test involves using sound waves and a computer (ultrasound) to create images of the inside of your uterus. Other tests include an internal exam of your vagina and uterus (pelvic exam) and measurement of your baby's heart rate.  You may be diagnosed with a threatened miscarriage if:  Ultrasound testing shows you are still pregnant.  Your baby's heart rate is strong.  A pelvic exam shows that the opening between your uterus and your vagina (cervix) is closed.  Your heart rate and blood pressure are stable.  Blood tests confirm you are still pregnant. TREATMENT  No treatments have been shown to prevent a threatened miscarriage from going on to a complete miscarriage. However, the right home care is important.  HOME CARE INSTRUCTIONS   Make sure you keep all your appointments for prenatal care. This is very important.  Get plenty of rest.  Do not have sex or use tampons if you have vaginal bleeding.  Do not douche.  Do not smoke or use recreational drugs.  Do not drink alcohol.  Avoid  caffeine. SEEK MEDICAL CARE IF:  You have light vaginal bleeding or spotting while pregnant.  You have abdominal pain or cramping.  You have a fever. SEEK IMMEDIATE MEDICAL CARE IF:  You have heavy vaginal bleeding.  You have blood clots coming from your vagina.  You have severe low back pain or abdominal cramps.  You have fever, chills, and severe abdominal pain. MAKE SURE YOU:  Understand these instructions.  Will watch your condition.  Will get help right away if you are not doing well or get worse. Document Released: 07/31/2005 Document Revised: 08/05/2013 Document Reviewed: 05/27/2013 [facility] Patient Information 2015 [place], [place]. This information is not intended to replace advice given to you by your health care provider. Make sure you discuss any questions you have with your health care provider.  Vaginal Bleeding During Pregnancy, First Trimester A small amount of bleeding (spotting) from the vagina is relatively common in early pregnancy. It usually stops on its own. Various things may cause bleeding or spotting in early pregnancy. Some bleeding may be related to the pregnancy, and some may not. In most cases, the bleeding is normal and is not a problem. However, bleeding can also be a sign of something serious. Be sure to tell your health care provider about any vaginal bleeding right away. Some possible causes of vaginal bleeding during the first trimester include:  Infection or inflammation of the cervix.  Growths (polyps) on the cervix.  Miscarriage or threatened miscarriage.  Pregnancy tissue has  developed outside of the uterus and in a fallopian tube (tubal pregnancy).  Tiny cysts have developed in the uterus instead of pregnancy tissue (molar pregnancy). HOME CARE INSTRUCTIONS  Watch your condition for any changes. The following actions may help to lessen any discomfort you are feeling:  Follow your health care provider's instructions for limiting your  activity. If your health care provider orders bed rest, you may need to stay in bed and only get up to use the bathroom. However, your health care provider may allow you to continue light activity.  If needed, make plans for someone to help with your regular activities and responsibilities while you are on bed rest.  Keep track of the number of pads you use each day, how often you change pads, and how soaked (saturated) they are. Write this down.  Do not use tampons. Do not douche.  Do not have sexual intercourse or orgasms until approved by your health care provider.  If you pass any tissue from your vagina, save the tissue so you can show it to your health care provider.  Only take over-the-counter or prescription medicines as directed by your health care provider.  Do not take aspirin because it can make you bleed.  Keep all follow-up appointments as directed by your health care provider. SEEK MEDICAL CARE IF:  You have any vaginal bleeding during any part of your pregnancy.  You have cramps or labor pains.  You have a fever, not controlled by medicine. SEEK IMMEDIATE MEDICAL CARE IF:   You have severe cramps in your back or belly (abdomen).  You pass large clots or tissue from your vagina.  Your bleeding increases.  You feel light-headed or weak, or you have fainting episodes.  You have chills.  You are leaking fluid or have a gush of fluid from your vagina.  You pass out while having a bowel movement. MAKE SURE YOU:  Understand these instructions.  Will watch your condition.  Will get help right away if you are not doing well or get worse. Document Released: 05/10/2005 Document Revised: 08/05/2013 Document Reviewed: 04/07/2013 [facility] Patient Information 2015 [place], [place]. This information is not intended to replace advice given to you by your health care provider. Make sure you discuss any questions you have with your health care provider.  Pelvic  Rest Pelvic rest is sometimes recommended for women when:   The placenta is partially or completely covering the opening of the cervix (placenta previa).  There is bleeding between the uterine wall and the amniotic sac in the first trimester (subchorionic hemorrhage).  The cervix begins to open without labor starting (incompetent cervix, cervical insufficiency).  The labor is too early (preterm labor). HOME CARE INSTRUCTIONS  Do not have sexual intercourse, stimulation, or an orgasm.  Do not use tampons, douche, or put anything in the vagina.  Do not lift anything over 10 pounds (4.5 kg).  Avoid strenuous activity or straining your pelvic muscles. SEEK MEDICAL CARE IF:  You have any vaginal bleeding during pregnancy. Treat this as a potential emergency.  You have cramping pain felt low in the stomach (stronger than menstrual cramps).  You notice vaginal discharge (watery, mucus, or bloody).  You have a low, dull backache.  There are regular contractions or uterine tightening. SEEK IMMEDIATE MEDICAL CARE IF: You have vaginal bleeding and have placenta previa.  Document Released: 11/25/2010 Document Revised: 10/23/2011 Document Reviewed: 11/25/2010 [facility] Patient Information 2015 [place], [place]. This information is not intended to replace advice  given to you by your health care provider. Make sure you discuss any questions you have with your health care provider.

## 2014-08-22 NOTE — MAU Note (Signed)
Pt presents complaining of abdominal cramping last night and vaginal bleeding today. The cramping has resolved.

## 2014-08-22 NOTE — MAU Provider Note (Signed)
History     CSN: [id number]  Arrival date and time: 08/22/14 1107  Provider notified: 1134 Orders placed in EPIC: 1144 Provider on unit: 1315 Provider at bedside: 1330     Chief Complaint  Patient presents with  . Vaginal Bleeding  . Abdominal Pain   Vaginal Bleeding Associated symptoms include abdominal pain.  Abdominal Pain    Ms. Jacqueline Barber 29 yo. female G2P1001 at 5.[redacted] wks gestation presenting with complaints of heavier vaginal bleeding and abdominal cramping. She reports that the bleeding started as spotting, but has now increased.  She has had cramping x 1 week (with + UPT).  She has not had a work-up for this pregnancy besides a (+) SPT.  Her blood type is A positive (per [facility] records) and her primary provider is Dr. Pamala Hurry.  Past Medical History  Diagnosis Date  . No pertinent past medical history   . Postpartum care following vaginal delivery (06/27/12) M 06/28/2012  . Vaginal bleeding in pregnant patient at less than [redacted] weeks gestation 08/22/2014    History reviewed. No pertinent past surgical history.  Family History  Problem Relation Age of Onset  . Hypertension Mother   . Hypertension Father   . Hyperlipidemia Father   . Cancer Brother     Leukemia  . Other Brother     found tumor on spine age 44..12/25/12  . Diabetes Maternal Grandfather     History  Substance Use Topics  . Smoking status: Former Smoker    Types: Cigarettes    Quit date: 10/13/2011  . Smokeless tobacco: Not on file  . Alcohol Use: 1.8 oz/week    3 Glasses of wine per week     Comment: Weekly.    Allergies:  Allergies  Allergen Reactions  . Amoxicillin Rash  . Cephalexin Rash    Prescriptions prior to admission  Medication Sig Dispense Refill Last Dose  . Prenatal Vit-Fe Fumarate-FA (PRENATAL MULTIVITAMIN) TABS Take 1 tablet by mouth daily.   08/21/2014 at Unknown time  . AMBULATORY NON FORMULARY MEDICATION Place 1 application rectally 2 (two) times daily. Medication Name:  Nifedipine 2% ointment (Patient not taking: Reported on 08/22/2014) 30 g 2 Taking    Review of Systems  Constitutional: Negative.   HENT: Negative.   Eyes: Negative.   Respiratory: Negative.   Cardiovascular: Negative.   Gastrointestinal: Positive for abdominal pain.  Genitourinary: Positive for vaginal bleeding.       Vaginal bleeding is heavier than this morning; started out this morning as spotting; abdominal cramping since (+) UPT  Musculoskeletal: Negative.   Skin: Negative.   Neurological: Negative.   Endo/Heme/Allergies: Negative.   Psychiatric/Behavioral: Negative.    Results for orders placed or performed during the hospital encounter of 08/22/14 (from the past 24 hour(s))  Urinalysis, Routine w reflex microscopic     Status: Abnormal   Collection Time: 08/22/14 11:15 AM  Result Value Ref Range   Color, Urine YELLOW YELLOW   APPearance CLEAR CLEAR   Specific Gravity, Urine 1.025 1.005 - 1.030   pH 6.0 5.0 - 8.0   Glucose, UA NEGATIVE NEGATIVE mg/dL   Hgb urine dipstick MODERATE (A) NEGATIVE   Bilirubin Urine NEGATIVE NEGATIVE   Ketones, ur NEGATIVE NEGATIVE mg/dL   Protein, ur NEGATIVE NEGATIVE mg/dL   Urobilinogen, UA 0.2 0.0 - 1.0 mg/dL   Nitrite NEGATIVE NEGATIVE   Leukocytes, UA NEGATIVE NEGATIVE  Urine microscopic-add on     Status: None   Collection Time: 08/22/14 11:15 AM  Result Value Ref Range   Squamous Epithelial / LPF RARE RARE   RBC / HPF 11-20 <3 RBC/hpf   Urine-Other MUCOUS PRESENT   Pregnancy, urine POC     Status: Abnormal   Collection Time: 08/22/14 11:32 AM  Result Value Ref Range   Preg Test, Ur POSITIVE (A) NEGATIVE  CBC     Status: None   Collection Time: 08/22/14 11:55 AM  Result Value Ref Range   WBC 9.9 4.0 - 10.5 K/uL   RBC 4.18 3.87 - 5.11 MIL/uL   Hemoglobin 12.8 12.0 - 15.0 g/dL   HCT 37.7 36.0 - 46.0 %   MCV 90.2 78.0 - 100.0 fL   MCH 30.6 26.0 - 34.0 pg   MCHC 34.0 30.0 - 36.0 g/dL   RDW 13.3 11.5 - 15.5 %   Platelets 242  150 - 400 K/uL  hCG, quantitative, pregnancy     Status: Abnormal   Collection Time: 08/22/14 11:55 AM  Result Value Ref Range   hCG, Beta Chain, Quant, S 23 (H) <5 mIU/mL   Physical Exam   Blood pressure 125/75, pulse 75, temperature 97.7 F (36.5 C), temperature source Oral, resp. rate 18, last menstrual period 07/14/2014.  Physical Exam  Constitutional: She appears well-developed and well-nourished.  HENT:  Head: Normocephalic and atraumatic.  Eyes: Pupils are equal, round, and reactive to light.  Neck: Normal range of motion.  Cardiovascular: Normal rate, regular rhythm and normal heart sounds.   Respiratory: Effort normal and breath sounds normal.  GI: Soft. Bowel sounds are normal.  Genitourinary:  Uterus non-tender; no adnexal masses, no adnexal tenderness Speculum Exam: moderate amount of dark, red blood with few, small clots; cleared with several large swabs; cervix visually closed, no active bleeding seen from cx os VE: closed/long/high     MAU Course  Procedures CBC HCG Speculum Exam  Assessment and Plan  29 yo G2P1001 at 5.[redacted] wks gestation Vaginal Bleeding in Pregnancy prior to 22 wks Threatened Miscarriage  Discharge Home  Bleeding Precautions reviewed Pelvic Rest F/U HCG on Monday 1/11 Call the office, if bleeding increases or pain is not improved  *Dr. Benjie Karvonen notified of assessment and plan - agrees  Graceann Congress MSN, CNM 08/22/2014, 1:59 PM

## 2015-05-26 LAB — OB RESULTS CONSOLE RUBELLA ANTIBODY, IGM: Rubella: IMMUNE

## 2015-05-26 LAB — OB RESULTS CONSOLE ABO/RH: RH Type: POSITIVE

## 2015-05-26 LAB — OB RESULTS CONSOLE HIV ANTIBODY (ROUTINE TESTING): HIV: NONREACTIVE

## 2015-05-26 LAB — OB RESULTS CONSOLE ANTIBODY SCREEN: Antibody Screen: NEGATIVE

## 2015-05-26 LAB — OB RESULTS CONSOLE RPR: RPR: NONREACTIVE

## 2015-06-11 LAB — OB RESULTS CONSOLE GC/CHLAMYDIA
CHLAMYDIA, DNA PROBE: NEGATIVE
Gonorrhea: NEGATIVE

## 2015-06-27 ENCOUNTER — Encounter (HOSPITAL_COMMUNITY): Payer: Self-pay | Admitting: *Deleted

## 2015-08-15 NOTE — L&D Delivery Note (Signed)
Delivery Note At 6:30 PM a viable and healthy female was delivered via Vaginal, Spontaneous Delivery (Presentation: Left Occiput Anterior).  APGAR: 8, 9; weight pending  .   Placenta status: Intact, Spontaneous.  Cord:  CANx 1 reduced 3 vessels with the following complications: None.  Cord pH: none  Anesthesia: Epidural  Episiotomy: None Lacerations: 2nd degree;Perineal Suture Repair: 3.0 chromic Est. Blood Loss (mL):  350  Mom to postpartum.  Baby to Couplet care / Skin to Skin.  [name] A 12/31/2015, 6:59 PM

## 2015-12-03 LAB — OB RESULTS CONSOLE GBS: GBS: NEGATIVE

## 2015-12-30 ENCOUNTER — Telehealth (HOSPITAL_COMMUNITY): Payer: Self-pay | Admitting: *Deleted

## 2015-12-30 ENCOUNTER — Encounter (HOSPITAL_COMMUNITY): Payer: Self-pay | Admitting: *Deleted

## 2015-12-30 NOTE — Telephone Encounter (Signed)
Preadmission screen  

## 2015-12-31 ENCOUNTER — Inpatient Hospital Stay (HOSPITAL_COMMUNITY): Payer: BC Managed Care – PPO | Admitting: Anesthesiology

## 2015-12-31 ENCOUNTER — Inpatient Hospital Stay (HOSPITAL_COMMUNITY)
Admission: RE | Admit: 2015-12-31 | Discharge: 2016-01-02 | DRG: 775 | Disposition: A | Discharge status: Home / Self Care | Payer: BC Managed Care – PPO | Source: Ambulatory Visit | Attending: Obstetrics and Gynecology | Admitting: Obstetrics and Gynecology

## 2015-12-31 ENCOUNTER — Encounter (HOSPITAL_COMMUNITY): Payer: Self-pay

## 2015-12-31 DIAGNOSIS — Z833 Family history of diabetes mellitus: Secondary | ICD-10-CM | POA: Diagnosis not present

## 2015-12-31 DIAGNOSIS — Z87891 Personal history of nicotine dependence: Secondary | ICD-10-CM

## 2015-12-31 DIAGNOSIS — O2613 Low weight gain in pregnancy, third trimester: Secondary | ICD-10-CM | POA: Diagnosis present

## 2015-12-31 DIAGNOSIS — Z880 Allergy status to penicillin: Secondary | ICD-10-CM | POA: Diagnosis not present

## 2015-12-31 DIAGNOSIS — O321XX0 Maternal care for breech presentation, not applicable or unspecified: Secondary | ICD-10-CM | POA: Diagnosis present

## 2015-12-31 DIAGNOSIS — Z3A39 39 weeks gestation of pregnancy: Secondary | ICD-10-CM

## 2015-12-31 DIAGNOSIS — Z8249 Family history of ischemic heart disease and other diseases of the circulatory system: Secondary | ICD-10-CM | POA: Diagnosis not present

## 2015-12-31 DIAGNOSIS — IMO0001 Reserved for inherently not codable concepts without codable children: Secondary | ICD-10-CM | POA: Diagnosis not present

## 2015-12-31 LAB — CBC
HCT: 37.6 % (ref 36.0–46.0)
Hemoglobin: 12.9 g/dL (ref 12.0–15.0)
MCH: 30.3 pg (ref 26.0–34.0)
MCHC: 34.3 g/dL (ref 30.0–36.0)
MCV: 88.3 fL (ref 78.0–100.0)
PLATELETS: 216 10*3/uL (ref 150–400)
RBC: 4.26 MIL/uL (ref 3.87–5.11)
RDW: 13.6 % (ref 11.5–15.5)
WBC: 12.2 10*3/uL — ABNORMAL HIGH (ref 4.0–10.5)

## 2015-12-31 LAB — TYPE AND SCREEN
ABO/RH(D): A POS
ANTIBODY SCREEN: NEGATIVE

## 2015-12-31 LAB — ABO/RH: ABO/RH(D): A POS

## 2015-12-31 LAB — RPR: RPR Ser Ql: NONREACTIVE

## 2015-12-31 MED ORDER — LACTATED RINGERS IV SOLN: 500.0000 mL | Freq: Once | INTRAVENOUS | Status: DC

## 2015-12-31 MED ORDER — FLEET ENEMA 7-19 GM/118ML RE ENEM: 1.0000 | ENEMA | RECTAL | Status: DC | PRN

## 2015-12-31 MED ORDER — LIDOCAINE HCL (PF) 1 % IJ SOLN
30.0000 mL | INTRAMUSCULAR | Status: DC | PRN
  Filled 2015-12-31: qty 30

## 2015-12-31 MED ORDER — CITRIC ACID-SODIUM CITRATE 334-500 MG/5ML PO SOLN: 30.0000 mL | ORAL | Status: DC | PRN

## 2015-12-31 MED ORDER — EPHEDRINE 5 MG/ML INJ
10.0000 mg | INTRAVENOUS | Status: DC | PRN
  Filled 2015-12-31: qty 2

## 2015-12-31 MED ORDER — FENTANYL 2.5 MCG/ML BUPIVACAINE 1/10 % EPIDURAL INFUSION (WH - ANES)
14.0000 mL/h | INTRAMUSCULAR | Status: DC | PRN
  Administered 2015-12-31: 14 mL/h via EPIDURAL
  Filled 2015-12-31: qty 125

## 2015-12-31 MED ORDER — FERROUS SULFATE 325 (65 FE) MG PO TABS
325.0000 mg | ORAL_TABLET | Freq: Two times a day (BID) | ORAL | Status: DC
  Administered 2016-01-01: 325 mg via ORAL
  Filled 2015-12-31: qty 1

## 2015-12-31 MED ORDER — PHENYLEPHRINE 40 MCG/ML (10ML) SYRINGE FOR IV PUSH (FOR BLOOD PRESSURE SUPPORT)
80.0000 ug | PREFILLED_SYRINGE | INTRAVENOUS | Status: DC | PRN
  Filled 2015-12-31: qty 5

## 2015-12-31 MED ORDER — TERBUTALINE SULFATE 1 MG/ML IJ SOLN
0.2500 mg | Freq: Once | INTRAMUSCULAR | Status: AC
  Administered 2015-12-31: 0.25 mg via SUBCUTANEOUS

## 2015-12-31 MED ORDER — LIDOCAINE HCL (PF) 1 % IJ SOLN
INTRAMUSCULAR | Status: DC | PRN
  Administered 2015-12-31 (×2): 4 mL via EPIDURAL

## 2015-12-31 MED ORDER — MENTHOL 3 MG MT LOZG
1.0000 | LOZENGE | OROMUCOSAL | Status: DC | PRN
  Filled 2015-12-31: qty 9

## 2015-12-31 MED ORDER — LACTATED RINGERS IV SOLN
INTRAVENOUS | Status: DC
  Administered 2015-12-31: 10:00:00 via INTRAUTERINE

## 2015-12-31 MED ORDER — PRENATAL MULTIVITAMIN CH
1.0000 | ORAL_TABLET | Freq: Every day | ORAL | Status: DC
  Administered 2016-01-01 – 2016-01-02 (×2): 1 via ORAL
  Filled 2015-12-31 (×2): qty 1

## 2015-12-31 MED ORDER — TERBUTALINE SULFATE 1 MG/ML IJ SOLN
INTRAMUSCULAR | Status: AC
  Filled 2015-12-31: qty 1

## 2015-12-31 MED ORDER — WITCH HAZEL-GLYCERIN EX PADS: 1.0000 "application " | MEDICATED_PAD | CUTANEOUS | Status: DC | PRN

## 2015-12-31 MED ORDER — COCONUT OIL OIL: 1.0000 "application " | TOPICAL_OIL | Status: DC | PRN

## 2015-12-31 MED ORDER — SIMETHICONE 80 MG PO CHEW
80.0000 mg | CHEWABLE_TABLET | ORAL | Status: DC | PRN
  Administered 2016-01-01: 80 mg via ORAL
  Filled 2015-12-31: qty 1

## 2015-12-31 MED ORDER — ACETAMINOPHEN 325 MG PO TABS
650.0000 mg | ORAL_TABLET | ORAL | Status: DC | PRN
  Administered 2015-12-31 – 2016-01-01 (×3): 650 mg via ORAL
  Filled 2015-12-31 (×3): qty 2

## 2015-12-31 MED ORDER — ZOLPIDEM TARTRATE 5 MG PO TABS: 5.0000 mg | ORAL_TABLET | Freq: Every evening | ORAL | Status: DC | PRN

## 2015-12-31 MED ORDER — PHENYLEPHRINE 40 MCG/ML (10ML) SYRINGE FOR IV PUSH (FOR BLOOD PRESSURE SUPPORT)
80.0000 ug | PREFILLED_SYRINGE | INTRAVENOUS | Status: DC | PRN
  Filled 2015-12-31: qty 10
  Filled 2015-12-31: qty 5

## 2015-12-31 MED ORDER — LACTATED RINGERS IV SOLN: 500.0000 mL | INTRAVENOUS | Status: DC | PRN

## 2015-12-31 MED ORDER — OXYTOCIN 40 UNITS IN LACTATED RINGERS INFUSION - SIMPLE MED
1.0000 m[IU]/min | INTRAVENOUS | Status: DC
  Administered 2015-12-31: 2 m[IU]/min via INTRAVENOUS
  Filled 2015-12-31: qty 1000

## 2015-12-31 MED ORDER — OXYCODONE-ACETAMINOPHEN 5-325 MG PO TABS: 1.0000 | ORAL_TABLET | ORAL | Status: DC | PRN

## 2015-12-31 MED ORDER — DIPHENHYDRAMINE HCL 50 MG/ML IJ SOLN: 12.5000 mg | INTRAMUSCULAR | Status: DC | PRN

## 2015-12-31 MED ORDER — OXYTOCIN 40 UNITS IN LACTATED RINGERS INFUSION - SIMPLE MED: 2.5000 [IU]/h | INTRAVENOUS | Status: DC

## 2015-12-31 MED ORDER — TERBUTALINE SULFATE 1 MG/ML IJ SOLN
0.2500 mg | Freq: Once | INTRAMUSCULAR | Status: DC | PRN
Start: 2015-12-31 — End: 2015-12-31
  Filled 2015-12-31: qty 1

## 2015-12-31 MED ORDER — ACETAMINOPHEN 325 MG PO TABS
650.0000 mg | ORAL_TABLET | ORAL | Status: DC | PRN
  Administered 2015-12-31: 650 mg via ORAL
  Filled 2015-12-31: qty 2

## 2015-12-31 MED ORDER — BENZOCAINE-MENTHOL 20-0.5 % EX AERO
1.0000 "application " | INHALATION_SPRAY | CUTANEOUS | Status: DC | PRN
  Administered 2016-01-01: 1 via TOPICAL
  Filled 2015-12-31: qty 56

## 2015-12-31 MED ORDER — OXYTOCIN BOLUS FROM INFUSION: 500.0000 mL | INTRAVENOUS | Status: DC

## 2015-12-31 MED ORDER — SENNOSIDES-DOCUSATE SODIUM 8.6-50 MG PO TABS
2.0000 | ORAL_TABLET | ORAL | Status: DC
  Administered 2015-12-31 – 2016-01-01 (×2): 2 via ORAL
  Filled 2015-12-31 (×2): qty 2

## 2015-12-31 MED ORDER — DIBUCAINE 1 % RE OINT: 1.0000 "application " | TOPICAL_OINTMENT | RECTAL | Status: DC | PRN

## 2015-12-31 MED ORDER — DIPHENHYDRAMINE HCL 25 MG PO CAPS: 25.0000 mg | ORAL_CAPSULE | Freq: Four times a day (QID) | ORAL | Status: DC | PRN

## 2015-12-31 MED ORDER — ONDANSETRON HCL 4 MG PO TABS: 4.0000 mg | ORAL_TABLET | ORAL | Status: DC | PRN

## 2015-12-31 MED ORDER — ONDANSETRON HCL 4 MG/2ML IJ SOLN
4.0000 mg | INTRAMUSCULAR | Status: DC | PRN
Start: 2015-12-31 — End: 2016-01-01

## 2015-12-31 MED ORDER — IBUPROFEN 600 MG PO TABS
600.0000 mg | ORAL_TABLET | Freq: Four times a day (QID) | ORAL | Status: DC
  Administered 2015-12-31 – 2016-01-02 (×7): 600 mg via ORAL
  Filled 2015-12-31 (×7): qty 1

## 2015-12-31 MED ORDER — ONDANSETRON HCL 4 MG/2ML IJ SOLN: 4.0000 mg | Freq: Four times a day (QID) | INTRAMUSCULAR | Status: DC | PRN

## 2015-12-31 MED ORDER — OXYCODONE-ACETAMINOPHEN 5-325 MG PO TABS: 2.0000 | ORAL_TABLET | ORAL | Status: DC | PRN

## 2015-12-31 MED ORDER — LACTATED RINGERS IV SOLN
INTRAVENOUS | Status: DC
  Administered 2015-12-31: 13:00:00 via INTRAVENOUS

## 2015-12-31 NOTE — Progress Notes (Signed)
S: Doing well, no complaints, pain well controlled, planning epidural later in labor  O: BP 100/71 mmHg  Pulse 89  Ht 5\' 2"  (1.575 m)  Wt 77.565 kg (171 lb)  BMI 31.27 kg/m2  LMP 04/02/2015   FHT:  FHR: 140s bpm, variability: moderate,  accelerations:  Present,  decelerations:  Present variable decels about 2 hrs ago, IUPC placed, no current decels UC:   regular, every 3 minutes, pit at 6 munits/ min SVE:   Dilation: 3 Effacement (%): 80 Exam by:: Cuyler Vandyken   A / P:  30 y.o.  Obstetric History   G3   P1   T1   P0   A0   TAB0   SAB0   E0   M0   L1    at [redacted]w[redacted]d IOL after successful ECV at term  Fetal Wellbeing:  Category I Pain Control:  Labor support without medications  Anticipated MOD:  NSVD  Jyair Kiraly,Weesner A. 12/31/2015, 11:30 AM

## 2015-12-31 NOTE — Progress Notes (Signed)
ECV:  Consent obtained. IV access intiated. CBC/ T/S sent. 20 min reactive NST done. Terbutaline given.   NST: 150s, + accels, no decels, 10 beat var. Reactive fetus. Toco: none U/s: frank breech, normal AFI, posterior placenta, head to maternal left, back to maternal left  Procedure: with u/s guidance and Artelia Laroche, CNM assisting, sacrum elevated out of the pelvis and with direction of the fetal head to maternal right, baby turned to transvere position. Baby held in this position to allow maternal rest, about 30 seconds. Head then directed to pelvis and sacrum directed to maternal left. Successful version. Good FH. Good maternal tolerance.   Decision made to proceed with IOL. AROM done, FSE placed, maternal binder placed, pitocin started.   Vern Prestia,Delaine A. 12/31/2015 8:40 AM

## 2015-12-31 NOTE — Progress Notes (Signed)
S: Doing well, no complaints, pain well controlled with epidural  O: BP 116/67 mmHg  Pulse 85  Temp(Src) 98.1 F (36.7 C)  Ht 5\' 2"  (1.575 m)  Wt 77.565 kg (171 lb)  BMI 31.27 kg/m2  SpO2 100%  LMP 04/02/2015   FHT:  FHR: 135s bpm, variability: moderate,  accelerations:  Present,  decelerations:  Present variable decels, quick/ sharp UC:   regular, every 3 minutes SVE:   Dilation: 4.5 Effacement (%): 80 Station: -2 Exam by:: Ala Capri   A / P:  30 y.o.  Obstetric History   G3   P1   T1   P0   A0   TAB0   SAB0   E0   M0   L1    at [redacted]w[redacted]d IOL after successful ECV, good progress  Fetal Wellbeing:  Category I Pain Control:  Labor support without medications  Anticipated MOD:  NSVD  Jacqueline Barber,Jacqueline A. 12/31/2015, 2:09 PM

## 2015-12-31 NOTE — H&P (Signed)
Jacqueline Barber is Barber 30 y.o. G3P1001 at [redacted]w[redacted]d presenting for ECV with IOL if successful and PCS if fails. Pt notes no contractions. Good fetal movement, No vaginal bleeding, not leaking fluid.  Breech presentation noted yesterday and here for version after R/B/Barber d/w pt.   [facility] at [facility] since 5 wks - dated by LMP c/w 5 wk u/s - h/o gest htn in prior preg, needing term IOL, nl bp's this pregnancy on baby ASA - low weight gain, 16# - low lying placenta, resolved - BREECH, noted at 38'6. Complete breech, AFI 18, post placenta, 8'0 (76%)   Prenatal Transfer Tool  Maternal Diabetes: No Genetic Screening: Normal Maternal Ultrasounds/Referrals: Normal Fetal Ultrasounds or other Referrals:  None Maternal Substance Abuse:  No Significant Maternal Medications:  None Significant Maternal Lab Results: None     OB History    Gravida Para Term Preterm AB TAB SAB Ectopic Multiple Living   3 1 1  0 0 0 0 0 0 1     Past Medical History  Diagnosis Date  . No pertinent past medical history   . Postpartum care following vaginal delivery (06/27/12) M 06/28/2012  . Vaginal bleeding in pregnant patient at less than [redacted] weeks gestation 08/22/2014  . Headache   . Vaginal Pap smear, abnormal   . Hemorrhoid   . Gestational hypertension     previous pregnancy   Past Surgical History  Procedure Laterality Date  . No past surgeries     Family History: family history includes Cancer in her brother; Diabetes in her paternal grandfather; Hyperlipidemia in her father; Hypertension in her father, maternal grandfather, maternal grandmother, and mother; Migraines in her father; Other in her brother; Urolithiasis in her father and mother. Social History:  reports that she quit smoking about 4 years ago. Her smoking use included Cigarettes. She does not have any smokeless tobacco history on file. She reports that she drinks about 1.8 oz of alcohol per week. She reports that she does not use illicit  drugs.  Review of Systems - Negative except concern over breech   Dilation: 2 Effacement (%): Thick Exam by:: Jacqueline Barber Height 5\' 2"  (1.575 m), weight 77.565 kg (171 lb), last menstrual period 04/02/2015, unknown if currently breastfeeding.  Physical Exam:  Gen: well appearing, no distress Abd: gravid, NT, no RUQ pain LE: no edema, equal bilaterally, non-tender Toco: rare FH: baseline 140s, accelerations present, no deceleratons, 10 beat variability  Prenatal labs: ABO, Rh: Barber/Positive/-- (10/12 0000) Antibody: Negative (10/12 0000) Rubella: !Error!immune RPR: Nonreactive (10/12 0000)  HBsAg:   neg HIV: Non-reactive (10/12 0000)  GBS: Negative (04/21 0000)  1 hr Glucola normal  Genetic screening nl NT, nl AFP Anatomy US normal   Assessment/Plan: 30 y.o. G3P1001 at [redacted]w[redacted]d Breech presentation. For ECV. Proceed with PCS if fails or fetal distress, plan IOL with AROM/ pit if successful version. Pt aware R/B or all options - GBS neg - PCN allergy   Jacqueline Barber,Jacqueline Barber. 30/19/2017, 8:28 AM

## 2015-12-31 NOTE — Anesthesia Preprocedure Evaluation (Signed)
Anesthesia Evaluation  Patient identified by MRN, date of birth, ID band Patient awake    Reviewed: Allergy & Precautions, NPO status , Patient's Chart, lab work & pertinent test results  Airway Mallampati: II  TM Distance: >3 FB Neck ROM: Full    Dental  (+) Teeth Intact   Pulmonary neg pulmonary ROS, former smoker,    breath sounds clear to auscultation       Cardiovascular hypertension,  Rhythm:Regular Rate:Normal     Neuro/Psych  Headaches, negative psych ROS   GI/Hepatic negative GI ROS, Neg liver ROS,   Endo/Other  negative endocrine ROS  Renal/GU negative Renal ROS  negative genitourinary   Musculoskeletal negative musculoskeletal ROS (+)   Abdominal   Peds negative pediatric ROS (+)  Hematology negative hematology ROS (+)   Anesthesia Other Findings   Reproductive/Obstetrics (+) Pregnancy                             Lab Results  Component Value Date   WBC 12.2* 12/31/2015   HGB 12.9 12/31/2015   HCT 37.6 12/31/2015   MCV 88.3 12/31/2015   PLT 216 12/31/2015   No results found for: INR, PROTIME   Anesthesia Physical Anesthesia Plan  ASA: II  Anesthesia Plan: Epidural   Post-op Pain Management:    Induction:   Airway Management Planned:   Additional Equipment:   Intra-op Plan:   Post-operative Plan:   Informed Consent: I have reviewed the patients History and Physical, chart, labs and discussed the procedure including the risks, benefits and alternatives for the proposed anesthesia with the patient or authorized representative who has indicated his/her understanding and acceptance.     Plan Discussed with:   Anesthesia Plan Comments:         Anesthesia Quick Evaluation

## 2015-12-31 NOTE — Anesthesia Procedure Notes (Signed)
Epidural Patient location during procedure: OB Start time: 12/31/2015 12:46 PM End time: 12/31/2015 12:52 PM  Staffing Anesthesiologist: Suella Broad D Performed by: anesthesiologist   Preanesthetic Checklist Completed: patient identified, site marked, surgical consent, pre-op evaluation, timeout performed, IV checked, risks and benefits discussed and monitors and equipment checked  Epidural Patient position: sitting Prep: ChloraPrep Patient monitoring: heart rate, continuous pulse ox and blood pressure Approach: midline Location: L3-L4 Injection technique: LOR saline  Needle:  Needle type: Tuohy  Needle gauge: 17 G Needle length: 9 cm Catheter type: closed end flexible Catheter size: 20 Guage Test dose: negative and 1.5% lidocaine  Assessment Events: blood not aspirated, injection not painful, no injection resistance and no paresthesia  Additional Notes LOR @ 5  Patient identified. Risks/Benefits/Options discussed with patient including but not limited to bleeding, infection, nerve damage, paralysis, failed block, incomplete pain control, headache, blood pressure changes, nausea, vomiting, reactions to medications, itching and postpartum back pain. Confirmed with bedside nurse the patient's most recent platelet count. Confirmed with patient that they are not currently taking any anticoagulation, have any bleeding history or any family history of bleeding disorders. Patient expressed understanding and wished to proceed. All questions were answered. Sterile technique was used throughout the entire procedure. Please see nursing notes for vital signs. Test dose was given through epidural catheter and negative prior to continuing to dose epidural or start infusion. Warning signs of high block given to the patient including shortness of breath, tingling/numbness in hands, complete motor block, or any concerning symptoms with instructions to call for help. Patient was given instructions on  fall risk and not to get out of bed. All questions and concerns addressed with instructions to call with any issues or inadequate analgesia.    Reason for block:procedure for pain

## 2015-12-31 NOTE — Anesthesia Pain Management Evaluation Note (Signed)
  CRNA Pain Management Visit Note  Patient: Jacqueline Barber, 30 y.o., female  "Hello I am a member of the anesthesia team at [facility]. We have an anesthesia team available at all times to provide care throughout the hospital, including epidural management and anesthesia for C-section. I don't know your plan for the delivery whether it a natural birth, water birth, IV sedation, nitrous supplementation, doula or epidural, but we want to meet your pain goals."   1.Was your pain managed to your expectations on prior hospitalizations?   Yes   2.What is your expectation for pain management during this hospitalization?     Epidural  3.How can we help you reach that goal? epidural  Record the patient's initial score and the patient's pain goal.   Pain: 0  Pain Goal: 4 [facility] wants you to be able to say your pain was always managed very well.  Crixus Mcaulay 12/31/2015

## 2016-01-01 LAB — CBC
HEMATOCRIT: 33.4 % — AB (ref 36.0–46.0)
HEMOGLOBIN: 11.3 g/dL — AB (ref 12.0–15.0)
MCH: 29.8 pg (ref 26.0–34.0)
MCHC: 33.8 g/dL (ref 30.0–36.0)
MCV: 88.1 fL (ref 78.0–100.0)
Platelets: 164 10*3/uL (ref 150–400)
RBC: 3.79 MIL/uL — AB (ref 3.87–5.11)
RDW: 13.6 % (ref 11.5–15.5)
WBC: 12.6 10*3/uL — AB (ref 4.0–10.5)

## 2016-01-01 MED ORDER — ACETAMINOPHEN FOR CIRCUMCISION 160 MG/5 ML
ORAL | Status: DC
Start: 2016-01-01 — End: 2016-01-02
  Filled 2016-01-01: qty 1.25

## 2016-01-01 NOTE — Anesthesia Postprocedure Evaluation (Signed)
Anesthesia Post Note  Patient: Jacqueline Barber  Procedure(s) Performed: * No procedures listed *  Patient location during evaluation: Mother Baby Anesthesia Type: Epidural Level of consciousness: oriented and awake and alert Pain management: pain level controlled Vital Signs Assessment: post-procedure vital signs reviewed and stable Respiratory status: spontaneous breathing and nonlabored ventilation Cardiovascular status: stable Postop Assessment: epidural receding, patient able to bend at knees, no signs of nausea or vomiting and adequate PO intake Anesthetic complications: no     Last Vitals:  Filed Vitals:   01/01/16 0219 01/01/16 0911  BP: 112/64 120/63  Pulse: 78 91  Temp: 36.7 C 36.4 C  Resp: 18 16    Last Pain:  Filed Vitals:   01/01/16 0913  PainSc: 0-No pain   Pain Goal:                 [employer]

## 2016-01-01 NOTE — Progress Notes (Signed)
PPD 1 SVD  S:  Reports feeling well             Tolerating po/ No nausea or vomiting             Bleeding is light             Pain controlled with motrin             Up ad lib / ambulatory / voiding QS  Newborn breast feeding  O:               VS: BP 120/63 mmHg  Pulse 91  Temp(Src) 97.6 F (36.4 C) (Oral)  Resp 16  Ht 5\' 2"  (1.575 m)  Wt 77.565 kg (171 lb)  BMI 31.27 kg/m2  SpO2 98%  LMP 04/02/2015  Breastfeeding? Unknown   LABS:              Recent Labs  12/31/15 0800 01/01/16 0514  WBC 12.2* 12.6*  HGB 12.9 11.3*  PLT 216 164               Blood type: --/--/A POS, A POS (05/19 0800)  Rubella: Immune (10/12 0000)                     I&O: Intake/Output      05/19 0701 - 05/20 0700 05/20 0701 - 05/21 0700   Blood 419    Total Output 419     Net -419                      Physical Exam:             Alert and oriented X3  Lungs: Clear and unlabored  Heart: regular rate and rhythm / no mumurs  Abdomen: soft, non-tender, non-distended              Fundus: firm, non-tender, Ueven  Perineum: ice pack inplace  Lochia: light  Extremities: trace edema, no calf pain or tenderness, TED hose in place (rolled down)   A: PPD # 1   Doing well - stable status  P: Routine post partum orders  DC tomorrow             Unroll TED hose to avoid constriction at knee  Artelia Laroche CNM, MSN, [facility] 01/01/2016, 10:51 AM

## 2016-01-01 NOTE — Lactation Note (Signed)
This note was copied from a baby's chart. Lactation Consultation Note  Patient Name: Jacqueline Barber [id number] Date: 01/01/2016 Reason for consult: Initial assessment   Initial visit at 19 hours of life. Mom nursed her 1st child for 14 months. "Beau" is currently sleeping, but she would like me to return to assist in latching. Mom has my # to call for assist w/next feeding.   Matthias Hughs [facility] 01/01/2016, 2:21 PM

## 2016-01-02 MED ORDER — IBUPROFEN 600 MG PO TABS: 600.0000 mg | ORAL_TABLET | Freq: Four times a day (QID) | ORAL | Status: DC

## 2016-01-02 NOTE — Lactation Note (Signed)
This note was copied from a baby's chart. Lactation Consultation Note  No questions or concerns.  Still tender but improving. Reviewed engorgement care and monitoring voids/stools. Mom encouraged to feed baby 8-12 times/24 hours and with feeding cues.  Mother states bf going well.  Has comfort gels and suggested coconut oil.  Patient Name: Jacqueline Barber [id number] Date: 01/02/2016 Reason for consult: Follow-up assessment   Maternal Data    Feeding Feeding Type: Breast Fed Length of feed: 20 min  LATCH Score/Interventions Latch: Grasps breast easily, tongue down, lips flanged, rhythmical sucking.  Audible Swallowing: A few with stimulation  Type of Nipple: Everted at rest and after stimulation  Comfort (Breast/Nipple): Filling, red/small blisters or bruises, mild/mod discomfort     Hold (Positioning): No assistance needed to correctly position infant at breast.  LATCH Score: 8  Lactation Tools Discussed/Used     Consult Status Consult Status: Complete    Carlye Grippe 01/02/2016, 11:14 AM

## 2016-01-02 NOTE — Discharge Summary (Signed)
Obstetric Discharge Summary Reason for Admission: induction of labor and Breech presentation with successful ECV Prenatal Procedures: ultrasound Intrapartum Procedures: external cephalic version, amniotomy,  spontaneous vaginal delivery Postpartum Procedures: none Complications-Operative and Postpartum: 2nd degree perineal laceration HEMOGLOBIN  Date Value Ref Range Status  01/01/2016 11.3* 12.0 - 15.0 g/dL Final   HCT  Date Value Ref Range Status  01/01/2016 33.4* 36.0 - 46.0 % Final    Physical Exam:  General: alert, cooperative and fatigued Lochia: appropriate Uterine Fundus: firm Incision: healing well DVT Evaluation: No evidence of DVT seen on physical exam. [facility] course: pt underwent ECV followed by IOL by Dr Pamala Hurry. Pt had amniotomy, pitocin, epidural Pt had uncomplicated vaginal delivery and postpartum course  Discharge Diagnoses:  successful ECV, Term Pregnancy-delivered  Discharge Information: Date: 01/02/2016 Activity: pelvic rest Diet: routine Medications: PNV and Ibuprofen Condition: stable Instructions: refer to practice specific booklet Discharge to: home   Follow up 6 weeks: Dr Aloha Gell Newborn Data: Live born female  Birth Weight: 7 lb 10.8 oz (3480 g) APGAR: 8, 9  Home with mother.   Jacqueline Barber 01/02/2016, 8:51 AM

## 2016-01-02 NOTE — Lactation Note (Signed)
This note was copied from a baby's chart. Lactation Consultation Note  Patient Name: Jacqueline Barber [id number] Date: 01/02/2016 Reason for consult: Follow-up assessment;Difficult latch  Baby 12 hours old. Mom teary when this LC entered the room, and baby fussy and crying. Mom reports that she has been using cross-cradle position. Assisted mom to latch baby in football position to right breast. Baby latched deeply, suckled rhythmically with a few swallows noted. Mom's breasts are easily compressible and expressible and colostrum flows easily. Mom reports sore nipples from earlier in the day. Mom's nipple are pink. Mom enc to use EBM and given comfort gels with instructions. Mom aware of OP/BFSG and [place] phone line assistance after D/C. Discussed cluster-feeding with mom and enc FOB to help mom with feeding and making sure mom gets some rest. Enc mom to continue to nurse with cues.  Maternal Data Has patient been taught Hand Expression?: Yes  Feeding Feeding Type: Breast Fed Length of feed:  (Assessed first 15 minutes of BF.)  LATCH Score/Interventions Latch: Grasps breast easily, tongue down, lips flanged, rhythmical sucking.  Audible Swallowing: A few with stimulation Intervention(s): Skin to skin;Hand expression  Type of Nipple: Everted at rest and after stimulation  Comfort (Breast/Nipple): Filling, red/small blisters or bruises, mild/mod discomfort  Problem noted: Mild/Moderate discomfort  Hold (Positioning): Assistance needed to correctly position infant at breast and maintain latch. Intervention(s): Breastfeeding basics reviewed;Support Pillows;Position options;Skin to skin  LATCH Score: 7  Lactation Tools Discussed/Used     Consult Status Consult Status: Follow-up Date: 01/03/16 Follow-up type: In-patient    Inocente Salles 01/02/2016, 12:02 AM

## 2016-01-02 NOTE — Progress Notes (Signed)
PPD 2 SVD  S:  Reports feeling ok             Tolerating po/ No nausea or vomiting             Bleeding is light             Pain controlled with motrin             Up ad lib / ambulatory / voiding QS  Newborn breast feeding    O:               VS: BP 113/74 mmHg  Pulse 73  Temp(Src) 97.5 F (36.4 C) (Axillary)  Resp 18  Ht 5\' 2"  (1.575 m)  Wt 77.565 kg (171 lb)  BMI 31.27 kg/m2  SpO2 99%  LMP 04/02/2015  Breastfeeding? Unknown   LABS:              Recent Labs  12/31/15 0800 01/01/16 0514  WBC 12.2* 12.6*  HGB 12.9 11.3*  PLT 216 164               Blood type: --/--/A POS, A POS (05/19 0800)  Rubella: Immune (10/12 0000)                                 Physical Exam:             Alert and oriented X3  Abdomen: soft, non-tender, non-distended              Fundus: firm, non-tender, U-1  Perineum: no edema  Lochia: light  Extremities: no edema, no calf pain or tenderness    A: PPD # 2   Doing well - stable status  P: Routine post partum orders  DC home  Artelia Laroche CNM, MSN, [facility] 01/02/2016, 8:49 AM

## 2016-01-07 ENCOUNTER — Inpatient Hospital Stay (HOSPITAL_COMMUNITY): Admission: AD | Admit: 2016-01-07 | Payer: BC Managed Care – PPO | Source: Ambulatory Visit | Admitting: Obstetrics

## 2016-01-09 ENCOUNTER — Inpatient Hospital Stay (HOSPITAL_COMMUNITY): Payer: BC Managed Care – PPO

## 2016-01-09 ENCOUNTER — Inpatient Hospital Stay (HOSPITAL_COMMUNITY)
Admission: AD | Admit: 2016-01-09 | Discharge: 2016-01-09 | Disposition: A | Discharge status: Home / Self Care | Payer: BC Managed Care – PPO | Source: Ambulatory Visit | Attending: Obstetrics & Gynecology | Admitting: Obstetrics & Gynecology

## 2016-01-09 ENCOUNTER — Encounter (HOSPITAL_COMMUNITY): Payer: Self-pay | Admitting: Obstetrics and Gynecology

## 2016-01-09 DIAGNOSIS — Z880 Allergy status to penicillin: Secondary | ICD-10-CM | POA: Diagnosis not present

## 2016-01-09 DIAGNOSIS — R51 Headache: Secondary | ICD-10-CM | POA: Diagnosis not present

## 2016-01-09 DIAGNOSIS — O864 Pyrexia of unknown origin following delivery: Secondary | ICD-10-CM | POA: Diagnosis present

## 2016-01-09 DIAGNOSIS — Z87891 Personal history of nicotine dependence: Secondary | ICD-10-CM | POA: Insufficient documentation

## 2016-01-09 DIAGNOSIS — O9089 Other complications of the puerperium, not elsewhere classified: Secondary | ICD-10-CM | POA: Insufficient documentation

## 2016-01-09 LAB — URINALYSIS, ROUTINE W REFLEX MICROSCOPIC
Bilirubin Urine: NEGATIVE
Glucose, UA: NEGATIVE mg/dL
Hgb urine dipstick: NEGATIVE
Ketones, ur: NEGATIVE mg/dL
Nitrite: NEGATIVE
Protein, ur: NEGATIVE mg/dL
Specific Gravity, Urine: 1.01 (ref 1.005–1.030)
pH: 6 (ref 5.0–8.0)

## 2016-01-09 LAB — CBC WITH DIFFERENTIAL/PLATELET
Basophils Absolute: 0 K/uL (ref 0.0–0.1)
Basophils Relative: 0 %
Eosinophils Absolute: 0.4 K/uL (ref 0.0–0.7)
Eosinophils Relative: 4 %
HCT: 37.7 % (ref 36.0–46.0)
Hemoglobin: 13.1 g/dL (ref 12.0–15.0)
Lymphocytes Relative: 3 %
Lymphs Abs: 0.3 K/uL — ABNORMAL LOW (ref 0.7–4.0)
MCH: 30.2 pg (ref 26.0–34.0)
MCHC: 34.7 g/dL (ref 30.0–36.0)
MCV: 86.9 fL (ref 78.0–100.0)
Monocytes Absolute: 0.3 K/uL (ref 0.1–1.0)
Monocytes Relative: 3 %
Neutro Abs: 8.4 K/uL — ABNORMAL HIGH (ref 1.7–7.7)
Neutrophils Relative %: 90 %
Platelets: 229 K/uL (ref 150–400)
RBC: 4.34 MIL/uL (ref 3.87–5.11)
RDW: 13 % (ref 11.5–15.5)
WBC: 9.4 K/uL (ref 4.0–10.5)

## 2016-01-09 LAB — COMPREHENSIVE METABOLIC PANEL
ALT: 36 U/L (ref 14–54)
ANION GAP: 11 (ref 5–15)
AST: 16 U/L (ref 15–41)
Albumin: 3.3 g/dL — ABNORMAL LOW (ref 3.5–5.0)
Alkaline Phosphatase: 82 U/L (ref 38–126)
BUN: 16 mg/dL (ref 6–20)
CHLORIDE: 103 mmol/L (ref 101–111)
CO2: 19 mmol/L — AB (ref 22–32)
Calcium: 9.3 mg/dL (ref 8.9–10.3)
Creatinine, Ser: 0.84 mg/dL (ref 0.44–1.00)
GFR calc non Af Amer: >60 mL/min (ref >60)
Glucose, Bld: 94 mg/dL (ref 65–99)
POTASSIUM: 3.5 mmol/L (ref 3.5–5.1)
SODIUM: 133 mmol/L — AB (ref 135–145)
Total Bilirubin: 0.2 mg/dL — ABNORMAL LOW (ref 0.3–1.2)
Total Protein: 6.8 g/dL (ref 6.5–8.1)

## 2016-01-09 LAB — URINE MICROSCOPIC-ADD ON

## 2016-01-09 LAB — AMYLASE: Amylase: 35 U/L (ref 28–100)

## 2016-01-09 LAB — LIPASE, BLOOD: LIPASE: 19 U/L (ref 11–51)

## 2016-01-09 MED ORDER — LACTATED RINGERS IV BOLUS (SEPSIS)
1000.0000 mL | Freq: Once | INTRAVENOUS | Status: AC
  Administered 2016-01-09: 1000 mL via INTRAVENOUS

## 2016-01-09 MED ORDER — IBUPROFEN 600 MG PO TABS
600.0000 mg | ORAL_TABLET | Freq: Once | ORAL | Status: AC
  Administered 2016-01-09: 600 mg via ORAL
  Filled 2016-01-09: qty 1

## 2016-01-09 MED ORDER — DIATRIZOATE MEGLUMINE & SODIUM 66-10 % PO SOLN
30.0000 mL | Freq: Once | ORAL | Status: AC
  Administered 2016-01-09: 30 mL via ORAL
  Filled 2016-01-09: qty 30

## 2016-01-09 MED ORDER — IOPAMIDOL (ISOVUE-300) INJECTION 61%
100.0000 mL | Freq: Once | INTRAVENOUS | Status: AC | PRN
  Administered 2016-01-09: 100 mL via INTRAVENOUS

## 2016-01-09 NOTE — MAU Note (Signed)
Vaginal delivery on 5/19, began having lower abd pain after [place] home, was prescribed antibiotics.  Started taking the meds on Friday, woke up that night with joint pain, fever, nausea.  Temp was 102.5 on Saturday morning, called [facility], was instructed to take tylenol & ibuprofen on regular basis.  Was due for ibuprofen @ 1430 today, temp was 104.9.  Was told by Dr. Dellis Filbert to come in.  Joint pain continues, also nauseated.

## 2016-01-09 NOTE — MAU Provider Note (Signed)
History     CSN: [id number]  Arrival date and time: 01/09/16 1525   First Provider Initiated Contact with Patient 01/09/16 1609      Chief Complaint  Patient presents with  . Fever  . Joint Pain   HPI  Ms.LENETTA PICHE is a 30 y.o. female [id number] status post vaginal delivery on 5/19. Here with fever, nausea, decreased appetite and joint pain. She was started on bactrim post partum for possible UTI; patient reported abdominal pain post-partum. This resolved.   She reports she spiked a fever Saturday morning (102); called the office and they instructed she take ibuprofen. This helped to resolve fever.  Until this afternoon she spiked 104.9  (oral). Again she took ibuprofen and it resolved. At this time she came to MAU.   She is breast feeding.   + Joint pain + flu like symptoms. Wrist, knee and elbow pain when she is active and moving around. Joint pain subsides when she is resting.    Has had head congestion since 5/18; denies HA, sore throat, or cough.  She denies back pain.   Patient denies sick contacts or known tick exposure.   OB History    Gravida Para Term Preterm AB TAB SAB Ectopic Multiple Living   '3 2 2 ' 0 1 0 1 0 0 2      Past Medical History  Diagnosis Date  . No pertinent past medical history   . Postpartum care following vaginal delivery (06/27/12) M 06/28/2012  . Vaginal bleeding in pregnant patient at less than [redacted] weeks gestation 08/22/2014  . Headache   . Vaginal Pap smear, abnormal   . Hemorrhoid   . Gestational hypertension     previous pregnancy    Past Surgical History  Procedure Laterality Date  . No past surgeries      Family History  Problem Relation Age of Onset  . Hypertension Mother   . Urolithiasis Mother   . Hypertension Father   . Hyperlipidemia Father   . Migraines Father   . Urolithiasis Father   . Cancer Brother     Leukemia  . Other Brother     found tumor on spine age 38..12/25/12  . Hypertension Maternal Grandfather    . Hypertension Maternal Grandmother   . Diabetes Paternal Grandfather     Social History  Substance Use Topics  . Smoking status: Former Smoker    Types: Cigarettes    Quit date: 10/13/2011  . Smokeless tobacco: None  . Alcohol Use: 1.8 oz/week    3 Glasses of wine per week     Comment: Weekly.    Allergies:  Allergies  Allergen Reactions  . Amoxicillin Rash    Has patient had a PCN reaction causing immediate rash, facial/tongue/throat swelling, SOB or lightheadedness with hypotension: Yes Has patient had a PCN reaction causing severe rash involving mucus membranes or skin necrosis: No Has patient had a PCN reaction that required hospitalization No Has patient had a PCN reaction occurring within the last 10 years: No If all of the above answers are "NO", then may proceed with Cephalosporin use.   . Cephalexin Rash    Prescriptions prior to admission  Medication Sig Dispense Refill Last Dose  . ibuprofen (ADVIL,MOTRIN) 600 MG tablet Take 1 tablet (600 mg total) by mouth every 6 (six) hours. 30 tablet 0 01/09/2016 at Unknown time  . Prenatal Vit-Fe Fumarate-FA (PRENATAL MULTIVITAMIN) TABS Take 1 tablet by mouth at bedtime.    01/08/2016 at Unknown  time  . sulfamethoxazole-trimethoprim (BACTRIM DS,SEPTRA DS) 800-160 MG tablet Take 1 tablet by mouth 2 (two) times daily.    01/09/2016 at 8   Results for orders placed or performed during the hospital encounter of 01/09/16 (from the past 48 hour(s))  Urinalysis, Routine w reflex microscopic (not at [facility])     Status: Abnormal   Collection Time: 01/09/16  3:50 PM  Result Value Ref Range   Color, Urine YELLOW YELLOW   APPearance CLEAR CLEAR   Specific Gravity, Urine 1.010 1.005 - 1.030   pH 6.0 5.0 - 8.0   Glucose, UA NEGATIVE NEGATIVE mg/dL   Hgb urine dipstick NEGATIVE NEGATIVE   Bilirubin Urine NEGATIVE NEGATIVE   Ketones, ur NEGATIVE NEGATIVE mg/dL   Protein, ur NEGATIVE NEGATIVE mg/dL   Nitrite NEGATIVE NEGATIVE    Leukocytes, UA SMALL (A) NEGATIVE  Urine microscopic-add on     Status: Abnormal   Collection Time: 01/09/16  3:50 PM  Result Value Ref Range   Squamous Epithelial / LPF 0-5 (A) NONE SEEN   WBC, UA 0-5 0 - 5 WBC/hpf   RBC / HPF 0-5 0 - 5 RBC/hpf   Bacteria, UA FEW (A) NONE SEEN  CBC with Differential     Status: Abnormal   Collection Time: 01/09/16  4:17 PM  Result Value Ref Range   WBC 9.4 4.0 - 10.5 K/uL   RBC 4.34 3.87 - 5.11 MIL/uL   Hemoglobin 13.1 12.0 - 15.0 g/dL   HCT 37.7 36.0 - 46.0 %   MCV 86.9 78.0 - 100.0 fL   MCH 30.2 26.0 - 34.0 pg   MCHC 34.7 30.0 - 36.0 g/dL   RDW 13.0 11.5 - 15.5 %   Platelets 229 150 - 400 K/uL   Neutrophils Relative % 90 %   Neutro Abs 8.4 (H) 1.7 - 7.7 K/uL   Lymphocytes Relative 3 %   Lymphs Abs 0.3 (L) 0.7 - 4.0 K/uL   Monocytes Relative 3 %   Monocytes Absolute 0.3 0.1 - 1.0 K/uL   Eosinophils Relative 4 %   Eosinophils Absolute 0.4 0.0 - 0.7 K/uL   Basophils Relative 0 %   Basophils Absolute 0.0 0.0 - 0.1 K/uL  Comprehensive metabolic panel     Status: Abnormal   Collection Time: 01/09/16  6:12 PM  Result Value Ref Range   Sodium 133 (L) 135 - 145 mmol/L   Potassium 3.5 3.5 - 5.1 mmol/L   Chloride 103 101 - 111 mmol/L   CO2 19 (L) 22 - 32 mmol/L   Glucose, Bld 94 65 - 99 mg/dL   BUN 16 6 - 20 mg/dL   Creatinine, Ser 0.84 0.44 - 1.00 mg/dL   Calcium 9.3 8.9 - 10.3 mg/dL   Total Protein 6.8 6.5 - 8.1 g/dL   Albumin 3.3 (L) 3.5 - 5.0 g/dL   AST 16 15 - 41 U/L   ALT 36 14 - 54 U/L   Alkaline Phosphatase 82 38 - 126 U/L   Total Bilirubin 0.2 (L) 0.3 - 1.2 mg/dL   GFR calc non Af Amer >60 >60 mL/min   GFR calc Af Amer >60 >60 mL/min    Comment: (NOTE) The eGFR has been calculated using the CKD EPI equation. This calculation has not been validated in all clinical situations. eGFR's persistently <60 mL/min signify possible Chronic Kidney Disease.    Anion gap 11 5 - 15  Amylase     Status: None   Collection Time: 01/09/16  6:12 PM  Result Value Ref Range   Amylase 35 28 - 100 U/L  Lipase, blood     Status: None   Collection Time: 01/09/16  6:12 PM  Result Value Ref Range   Lipase 19 11 - 51 U/L   Review of Systems  Constitutional: Positive for fever and chills.  HENT: Negative for sore throat.   Respiratory: Negative for shortness of breath.   Gastrointestinal: Positive for nausea and abdominal pain (Occasional ). Negative for vomiting, diarrhea and constipation.  Genitourinary: Negative for dysuria, urgency, frequency, hematuria and flank pain.  Musculoskeletal: Positive for joint pain.  Neurological: Negative for headaches.   Physical Exam   Blood pressure 117/66, pulse 91, temperature 99 F (37.2 C), temperature source Axillary, resp. rate 18, last menstrual period 04/02/2015, SpO2 98 %, unknown if currently breastfeeding.  Physical Exam  Constitutional: She is oriented to person, place, and time. She appears well-developed and well-nourished. No distress.  HENT:  Head: Normocephalic.  Eyes: Pupils are equal, round, and reactive to light.  Neck: Neck supple. Normal range of motion present.  Cardiovascular: Normal rate and normal heart sounds.   Respiratory: Effort normal. Right breast exhibits no inverted nipple, no mass, no nipple discharge, no skin change and no tenderness. Left breast exhibits no inverted nipple, no mass, no nipple discharge, no skin change and no tenderness. Breasts are symmetrical.  GI: Soft. She exhibits no distension and no mass. There is no tenderness. There is no rebound, no guarding and no CVA tenderness.  Genitourinary:  Bimanual exam: Cervix closed, anterior. No CMT Scant amount of dark red blood noted on glove.  Uterus mildly tender, slightly enlarged  Adnexa non tender, no masses bilaterally Chaperone present for exam. Patient tolerated exam well   Musculoskeletal: Normal range of motion.       Lumbar back: She exhibits normal range of motion, no tenderness, no  bony tenderness, no swelling, no edema and no pain.  Lymphadenopathy:       Head (right side): No submental, no submandibular, no tonsillar, no preauricular, no posterior auricular and no occipital adenopathy present.       Head (left side): No submental, no submandibular, no tonsillar, no preauricular, no posterior auricular and no occipital adenopathy present.  Neurological: She is alert and oriented to person, place, and time.  Skin: Skin is dry. She is not diaphoretic. There is erythema (noted on chest wall and bilateral cheak bones. Non petechiae appearing. ).  Psychiatric: Her behavior is normal.    MAU Course  Procedures  None  MDM  CBC with diff UA Urine culture pending  Discussed patient with Dr. Pamala Hurry. CMP, Amylase, Lipase added, Abdomen/Pelvis CT   Report given to [facility] who resumes care of the patient. Patient awaiting CT scan at this time.   Lezlie Lye, NP 01/09/2016 8:08 PM  Dr. Pamala Hurry in MAU has assumed care of the patient at 2043.   Assessment and Plan

## 2016-01-09 NOTE — H&P (Signed)
Chief complaint: Fever to 104  History of present illness: 30 year old G3 P20129 days postpartum after induction of labor at 39 weeks immediately following a successful external cephalic version from the breech presentation. After version artificial rupture of membranes was done and a fetal scalp electrode was placed and Pitocin was started. Patient delivered within 12 hours without any complications and had a second-degree laceration. Patient notes slight headache immediately after delivery but this improved. Once returning home patient reports never quite feeling herself but couldn't exactly identify a focal symptom. Patient notes very mild headache and fatigue since delivery. She continued to breast-feed. About 7 days postpartum patient developed dysuria with back pain and suprapubic pain but was without fevers and an outpatient urine culture was obtained and she was started on Bactrim DS by mouth twice a day for possible UTI. Medication was started on Friday.  Patient has an allergy to penicillin and cephalosporins. Patient states shortly after starting the Bactrim she started having arthralgias in her elbow, hands and knees. Patient notes joint pain is only present with movement of the joint but she has no pain at rest. On Saturday patient notes fevers to 102. She called the on-call midwife who recommended she take Motrin and Tylenol alternating doses. Patient states she stayed in the bed all day Saturday with fever headache dizziness and arthralgias. She continued to breast-feed. This afternoon patient had a temperature to 104 and came in for evaluation. Patient notes with each fever she starts to feel better after taking ibuprofen though Tylenol is less effective for her symptoms. Her headache and dizziness are significant with fevers but when she is afebrile there is no dizziness and headache is only minimal. Patient does note slight nausea but no emesis. Patient does notes a sore in her mouth that started  before she began the Bactrim and that sore is no longer present. Patient does have a warm but nontender rash on her chest and neck that is more intense when she has a fever.  Patient has no known sick contacts. Patient has no travel. Patient has no history of tick or other insect bite. Patient has no family history of rheumatologic disorders. Patient does have history of rash to penicillin and Keflex and has never had a sulfa drug prior. Patient has no personal or family history of venous thromboembolism.  Patient denies any breast tenderness and notes good milk production with baby gaining weight. Patient notes dysuria has improved since her Bactrim began. Patient notes minimal lochia.   Past Medical History  Diagnosis Date  . No pertinent past medical history   . Postpartum care following vaginal delivery (06/27/12) M 06/28/2012  . Vaginal bleeding in pregnant patient at less than [redacted] weeks gestation 08/22/2014  . Headache   . Vaginal Pap smear, abnormal   . Hemorrhoid   . Gestational hypertension     previous pregnancy    Past Surgical History  Procedure Laterality Date  . No past surgeries      Allergies: Amoxicillin, cephalosporin  Medications: Prenatal vitamin, Bactrim  Physical exam: Filed Vitals:   01/09/16 1545 01/09/16 1901 01/09/16 1953 01/09/16 2117  BP: 107/69 117/66    Pulse: 106 91    Temp: 98.6 F (37 C) 98.6 F (37 C) 99 F (37.2 C) 99.3 F (37.4 C)  TempSrc: Oral Oral Axillary Oral  Resp: 20 18    SpO2: 98%      Gen.: No distress, appears slightly worried but not overly anxious Skin: Erythematous and warm  rash on her chest and neck. No blistering is present. No pain on palpation of the skin. No rash elsewhere on her body. No mucosal ulcerations in her mouth. HEENT: No thyromegaly or tenderness Lymphatic: No axillary or supraclavicular adenopathy. No inguinal adenopathy Cardiovascular: Regular rate and rhythm, no murmur Pulmonary: Clear to auscultation  bilaterally Abdomen: No right upper quadrant pain, fundus palpable at the umbilicus and nontender, no rebound, no guarding, no masses GU: Appropriate lochia, well-healing obstetric laceration with good reapproximation of tissues: Cervix closed, no cervical motion tenderness, no uterine tenderness, no adnexal masses Back: No costovertebral angle tenderness, epidural site palpated with no pain swelling or erythema Lower extremity: Bilateral lower extremity with no edema, no warmth, no popliteal tenderness or cords  CBC    Component Value Date/Time   WBC 9.4 01/09/2016 1617   RBC 4.34 01/09/2016 1617   HGB 13.1 01/09/2016 1617   HCT 37.7 01/09/2016 1617   PLT 229 01/09/2016 1617   MCV 86.9 01/09/2016 1617   MCH 30.2 01/09/2016 1617   MCHC 34.7 01/09/2016 1617   RDW 13.0 01/09/2016 1617   LYMPHSABS 0.3* 01/09/2016 1617   MONOABS 0.3 01/09/2016 1617   EOSABS 0.4 01/09/2016 1617   BASOSABS 0.0 01/09/2016 1617     CBC    Component Value Date/Time   WBC 9.4 01/09/2016 1617   RBC 4.34 01/09/2016 1617   HGB 13.1 01/09/2016 1617   HCT 37.7 01/09/2016 1617   PLT 229 01/09/2016 1617   MCV 86.9 01/09/2016 1617   MCH 30.2 01/09/2016 1617   MCHC 34.7 01/09/2016 1617   RDW 13.0 01/09/2016 1617   LYMPHSABS 0.3* 01/09/2016 1617   MONOABS 0.3 01/09/2016 1617   EOSABS 0.4 01/09/2016 1617   BASOSABS 0.0 01/09/2016 1617     CMP     Component Value Date/Time   NA 133* 01/09/2016 1812   K 3.5 01/09/2016 1812   CL 103 01/09/2016 1812   CO2 19* 01/09/2016 1812   GLUCOSE 94 01/09/2016 1812   BUN 16 01/09/2016 1812   CREATININE 0.84 01/09/2016 1812   CALCIUM 9.3 01/09/2016 1812   PROT 6.8 01/09/2016 1812   ALBUMIN 3.3* 01/09/2016 1812   AST 16 01/09/2016 1812   ALT 36 01/09/2016 1812   ALKPHOS 82 01/09/2016 1812   BILITOT 0.2* 01/09/2016 1812   GFRNONAA >60 01/09/2016 1812   GFRAA >60 01/09/2016 1812    CT scan: No acute findings. No evidence for uterine infection, pelvic abscess,  septic pelvic thrombophlebitis  Assessment and plan: 30 year old G3 P201 to 10 days postpartum with fever 104 associated with erythematous rash of the chest and neck and significant arthralgias. Patient with also with mild headache, dizziness associated with the fever. Patient with mild nausea but no emesis. Improving dysuria status post Bactrim although the fevers and arthralgias began after the Bactrim. Exam without focal findings other than warm erythematous chest rash and fever. CBC and CMP as well as CT scan is negative. Unclear etiology though given timing of antibiotic administration I suspect this is a constellation of Bactrim induced symptoms. Stable labs, lack of mucosal involvement, lack of skin pain at the site of the rash are favorable signs the Stevens-Johnson syndrome is a potential serious side effects of Bactrim. Current symptoms potentially could represent a prodromal phase. Septic pelvic thrombophlebitis was also in the differential but given the normal CT scan we have ruled that out. Uterus is nontender and while common,  endometritis is unlikely.  Will complete 2 L of  IV fluid rehydration and discharged to home. Patient has been instructed to stop the Bactrim will follow-up on urine culture and sensitivity on Tuesday. She is to take Motrin 600 mg every 6 hours and can also add Tylenol 1 g every 6 hours as she has symptomatic relief from these. Patient is to call with fevers not responding to these medications. Patient is also to call with any new symptoms or any focal symptoms. She was been instructed to watch the rash and should any blistering development of the rash she is to present for evaluation.   Ola Fawver,Lasseter A. 01/09/2016 10:39 PM

## 2016-01-11 LAB — URINE CULTURE

## 2017-02-18 IMAGING — CT CT ABD-PELV W/ CM
1 of 3 series · 15 of 32 positions shown, 19 images · IV contrast (OMNIPAQUE)
Comparison: None.

CLINICAL DATA: Recent vaginal delivery 9 days ago with persistent
abdominal pain and fevers

EXAM:
CT ABDOMEN AND PELVIS WITH CONTRAST
TECHNIQUE: Multidetector CT imaging of the abdomen and pelvis was performed
using the standard protocol following bolus administration of
intravenous contrast.
CONTRAST:  100mL 0URP4K-GOO IOPAMIDOL (0URP4K-GOO) INJECTION 61%

[Series 3: (person_name) thins · axial · 0.82mm/px · z∈[+576,+996]mm · 15 of 653 slices shown, 19 images]
[im 26/653  soft-tissue]
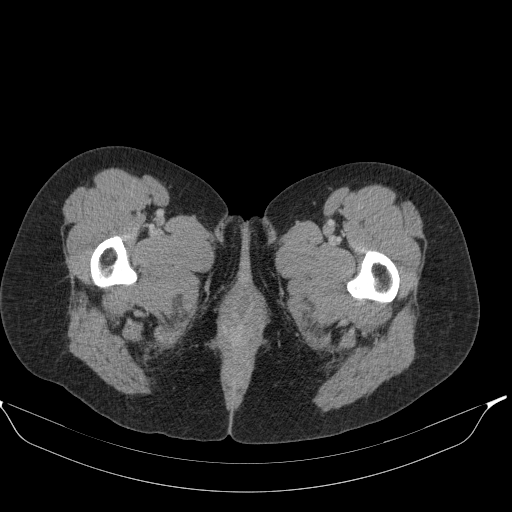
[im 26/653  bone]
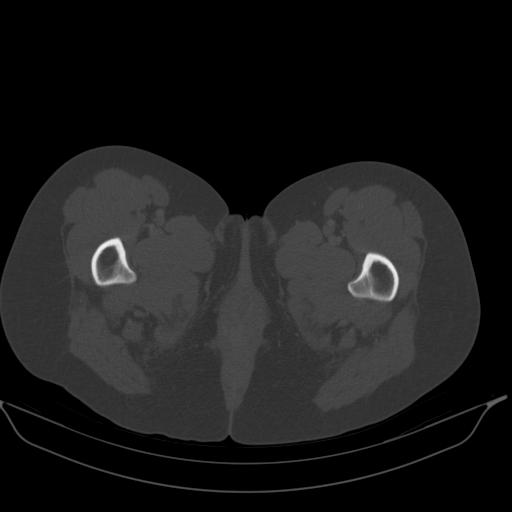
[im 76/653  soft-tissue]
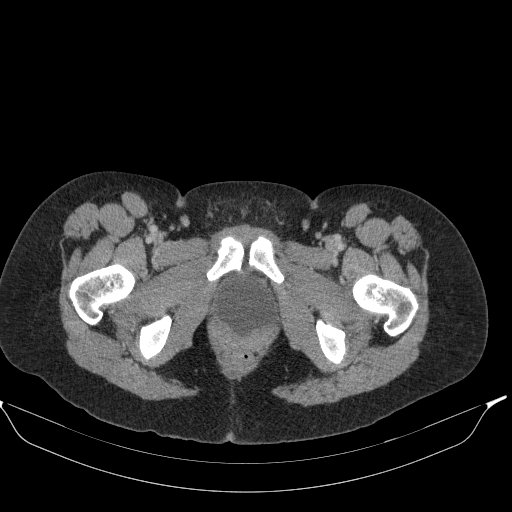
[im 126/653  soft-tissue]
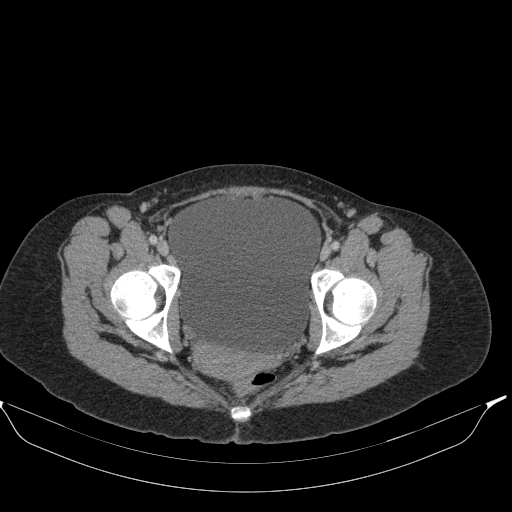
[im 176/653  soft-tissue]
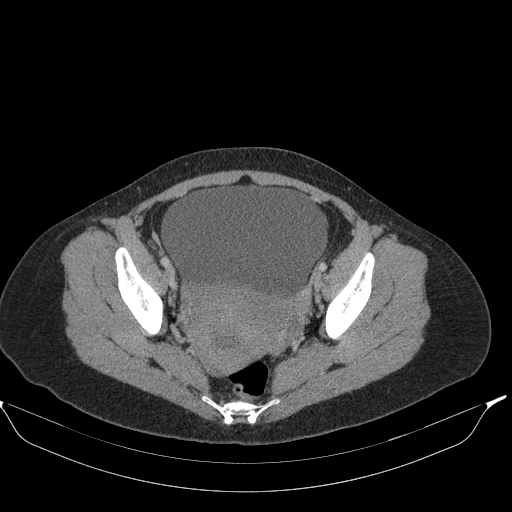
[im 226/653  soft-tissue]
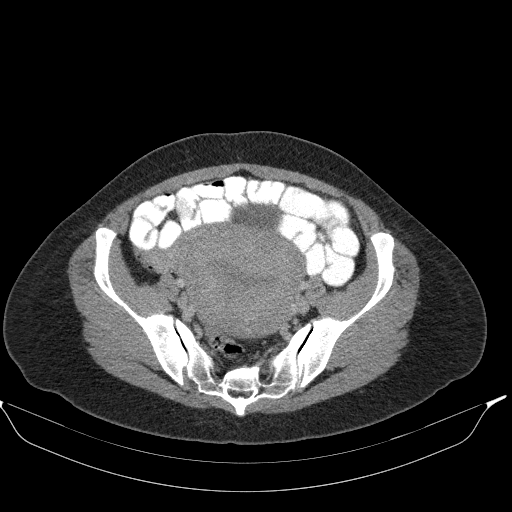
[im 276/653  soft-tissue]
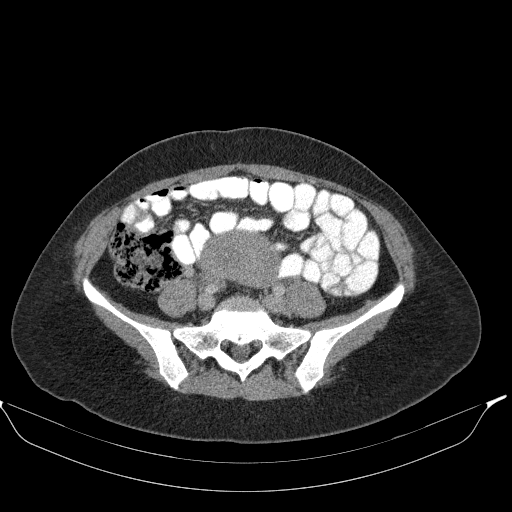
[im 327/653  soft-tissue]
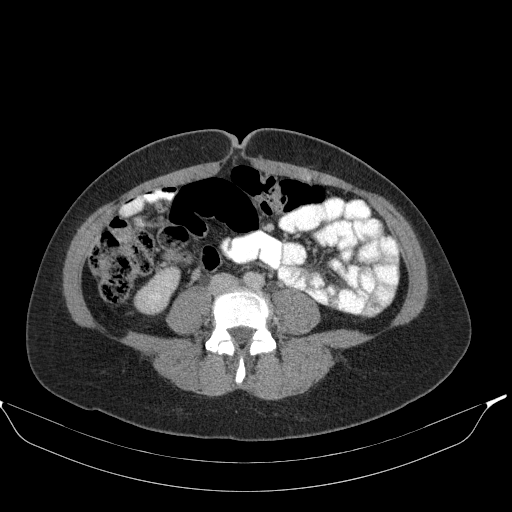
[im 377/653  soft-tissue]
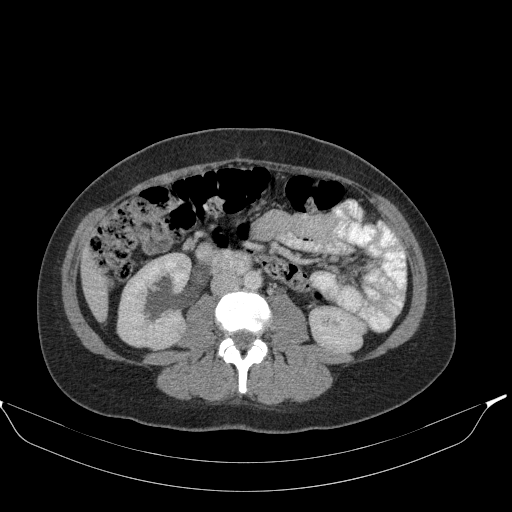
[im 427/653  soft-tissue]
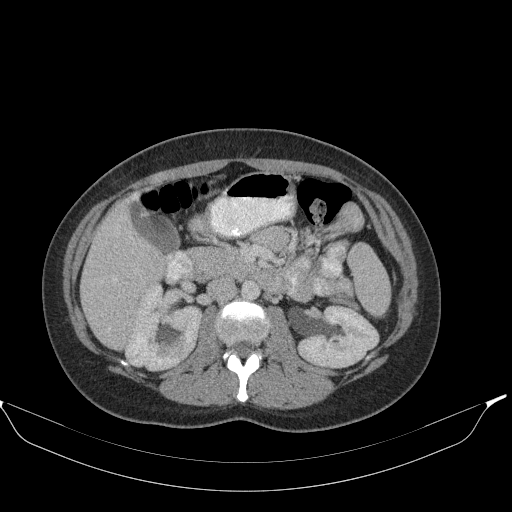
[im 427/653  bone]
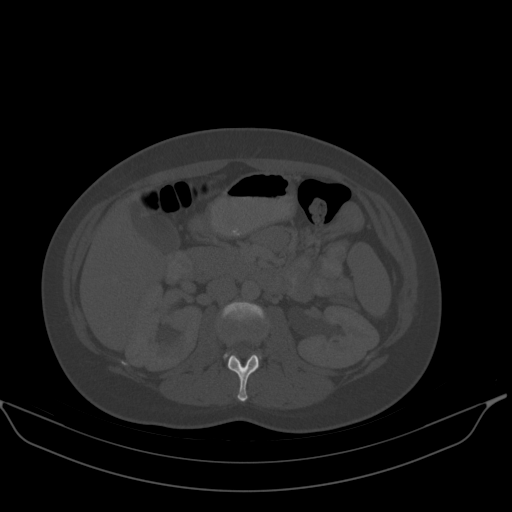
[im 477/653  soft-tissue]
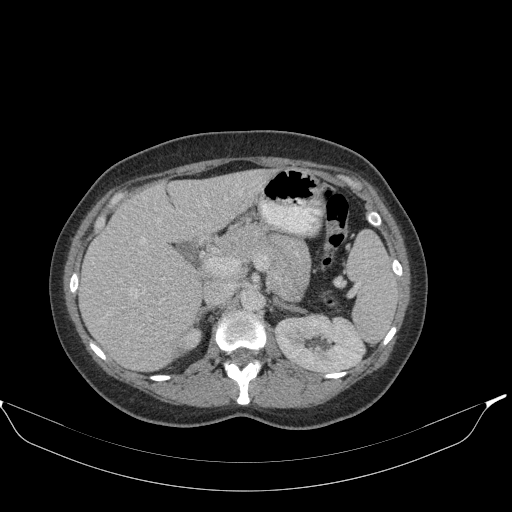
[im 527/653  soft-tissue]
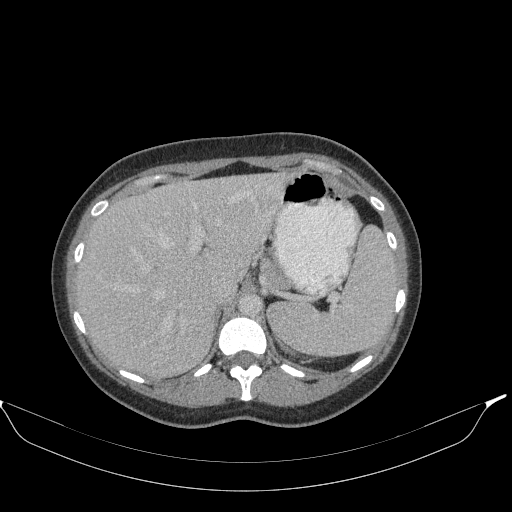
[im 552/653  lung]
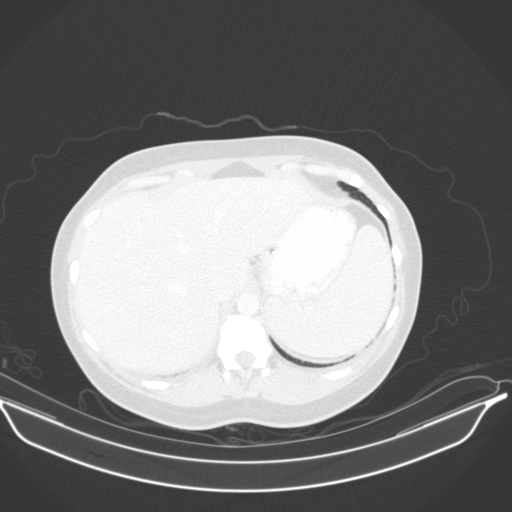
[im 577/653  soft-tissue]
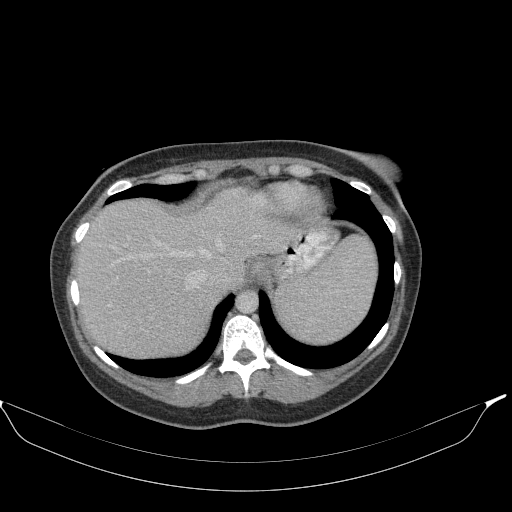
[im 577/653  lung]
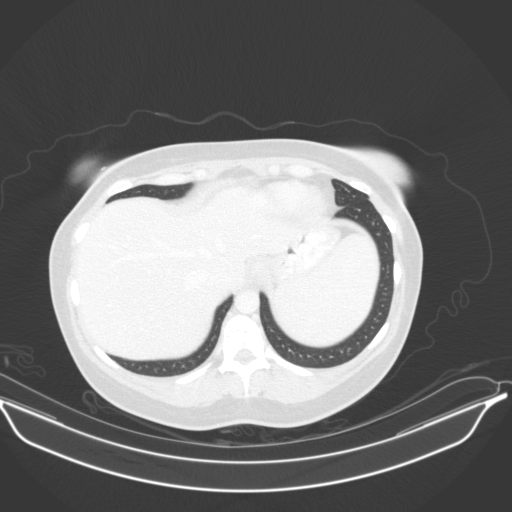
[im 602/653  lung]
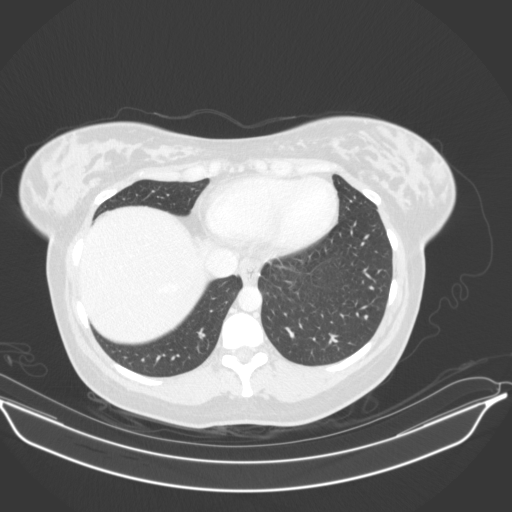
[im 627/653  soft-tissue]
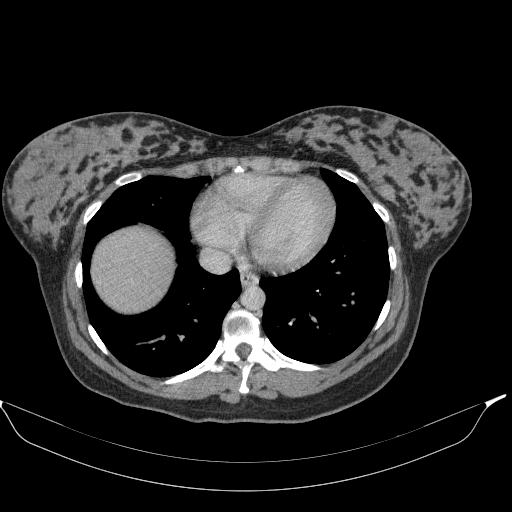
[im 627/653  lung]
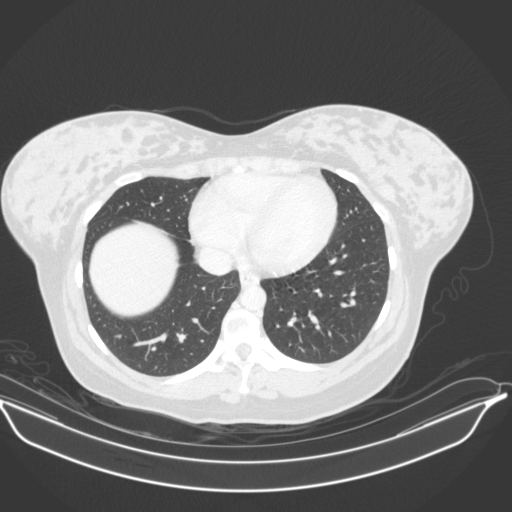

[15 of 32 positions shown; findings below may reference images not displayed]

FINDINGS: Lung bases are free of acute infiltrate or sizable effusion. The
breasts are engorged consistent with the postpartum state.

The liver, gallbladder, spleen, adrenal glands and pancreas are
within normal limits. Kidneys are well visualized bilaterally and
demonstrate bilateral hydronephrosis. No definitive calculi are
seen. This is likely sequelae of the recent pregnancy and an over
distended bladder. The uterus is enlarged consistent with postpartum
state. Fluid is noted in the endometrial canal. The appendix is well
visualized and within normal limits. No bony abnormality is noted.
IMPRESSION: Bilateral hydronephrosis right worse than left likely related to the
recent gravid state and a currently over distended bladder.

Prominent uterus consistent with the postpartum state. No other
focal abnormality is noted.

## 2019-04-24 ENCOUNTER — Encounter (HOSPITAL_COMMUNITY): Payer: Self-pay

## 2019-04-24 ENCOUNTER — Other Ambulatory Visit (HOSPITAL_COMMUNITY): Payer: Self-pay | Admitting: Obstetrics

## 2019-04-24 DIAGNOSIS — O285 Abnormal chromosomal and genetic finding on antenatal screening of mother: Secondary | ICD-10-CM

## 2019-04-24 DIAGNOSIS — Z3A14 14 weeks gestation of pregnancy: Secondary | ICD-10-CM

## 2019-04-25 ENCOUNTER — Ambulatory Visit (HOSPITAL_COMMUNITY): Payer: BC Managed Care – PPO | Admitting: *Deleted

## 2019-04-25 ENCOUNTER — Ambulatory Visit (HOSPITAL_COMMUNITY): Payer: BC Managed Care – PPO

## 2019-04-25 ENCOUNTER — Other Ambulatory Visit: Payer: Self-pay

## 2019-04-25 ENCOUNTER — Other Ambulatory Visit (HOSPITAL_COMMUNITY): Payer: Self-pay | Admitting: Obstetrics

## 2019-04-25 ENCOUNTER — Ambulatory Visit (HOSPITAL_COMMUNITY)
Admission: RE | Admit: 2019-04-25 | Discharge: 2019-04-25 | Disposition: A | Discharge status: Home / Self Care | Payer: BC Managed Care – PPO | Source: Ambulatory Visit | Attending: Obstetrics and Gynecology | Admitting: Obstetrics and Gynecology

## 2019-04-25 ENCOUNTER — Encounter (HOSPITAL_COMMUNITY): Payer: Self-pay

## 2019-04-25 VITALS — BP 130/71 | HR 79 | Temp 98.6°F | Wt 149.0 lb

## 2019-04-25 DIAGNOSIS — O09812 Supervision of pregnancy resulting from assisted reproductive technology, second trimester: Secondary | ICD-10-CM | POA: Diagnosis not present

## 2019-04-25 DIAGNOSIS — O3112X0 Continuing pregnancy after spontaneous abortion of one fetus or more, second trimester, not applicable or unspecified: Secondary | ICD-10-CM

## 2019-04-25 DIAGNOSIS — Z3A14 14 weeks gestation of pregnancy: Secondary | ICD-10-CM | POA: Insufficient documentation

## 2019-04-25 DIAGNOSIS — O285 Abnormal chromosomal and genetic finding on antenatal screening of mother: Secondary | ICD-10-CM

## 2019-04-25 DIAGNOSIS — O30002 Twin pregnancy, unspecified number of placenta and unspecified number of amniotic sacs, second trimester: Secondary | ICD-10-CM | POA: Diagnosis not present

## 2019-04-25 DIAGNOSIS — O3412 Maternal care for benign tumor of corpus uteri, second trimester: Secondary | ICD-10-CM

## 2019-04-25 DIAGNOSIS — O289 Unspecified abnormal findings on antenatal screening of mother: Secondary | ICD-10-CM

## 2019-04-25 DIAGNOSIS — D259 Leiomyoma of uterus, unspecified: Secondary | ICD-10-CM

## 2019-04-25 DIAGNOSIS — Z1379 Encounter for other screening for genetic and chromosomal anomalies: Secondary | ICD-10-CM

## 2019-04-28 ENCOUNTER — Other Ambulatory Visit: Payer: Self-pay | Admitting: Obstetrics

## 2019-04-28 ENCOUNTER — Telehealth (HOSPITAL_COMMUNITY): Payer: Self-pay | Admitting: *Deleted

## 2019-04-28 ENCOUNTER — Ambulatory Visit (HOSPITAL_COMMUNITY): Payer: BC Managed Care – PPO

## 2019-04-28 ENCOUNTER — Telehealth (HOSPITAL_COMMUNITY): Payer: Self-pay | Admitting: Genetic Counselor

## 2019-04-28 DIAGNOSIS — Z419 Encounter for procedure for purposes other than remedying health state, unspecified: Secondary | ICD-10-CM

## 2019-04-28 NOTE — Telephone Encounter (Addendum)
I called Ms. 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 No numerical abnormalities were identified for chromosomes X, Y, 13, or 21, reducing the likelihood of trisomy 53, trisomy 73, and monosomy X in the fetus. I reminded Ms. Hillard that while [facility] results are not considered diagnostic, this result is consistent with results from [facility]. We discussed that fetal karyotype on the [facility] sample is still being completed. While it is expected that karyotype will confirm the results from [facility], there is the possibility of something else being identified on karyotype, such as a chromosomal rearrangement. It is recommended that individuals wait to receive final results from karyotype to make a decision about continuation versus termination of a pregnancy.  Trisomy 64 is a chromosomal condition associated with many abnormalities in the body. Fetuses with trisomy 62 often demonstrate fetal growth restriction, cardiac defects, and abnormalities of other organs. Other ultrasound findings may include an abnormally shaped head, small jaw and mouth, and clenched fists with overlapping fingers. Infants with trisomy 60 often pass away during pregnancy or shortly after birth. Approximately 50% of liveborn babies with trisomy 18 die within the first week of life, with only 5-10% of infants surviving the first year of life. Most cases of trisomy 18 result from having three copies of chromosome 18 in each cell of the body instead of the usual two copies. Approximately 94% of trisomy 18 cases occur by chance due to errors in chromosomal division in a process called nondisjunction. Very rarely, trisomy 18 may occur as a result of a chromosomal translocation. Results from karyotype can determine whether a fetus has full trisomy 74 or trisomy 18 due to a translocation, while results from [facility] cannot.  Once a pregnancy is found to have trisomy 18, there are several different ways an individual may choose to proceed with  the pregnancy. If the pregnancy is continued, it would likely be followed via ultrasounds and a fetal echocardiogram (detailed ultrasound of the fetal heart). Some individuals choose to pursue adoption as an option. It is also an option to end the pregnancy until 20 weeks' gestation in the state of [place].Termination is still an option past 20 weeks' gestation in other states. Ms. Batchelor indicated that her and her husband are going to opt toend the pregnancy. Ms. Sarafian was not surprised by the positive result and indicated that they have had time to reflect and come to this decision.   Termination will likely be performed via a dilation and evacuation (D&E) procedure, which is the most common type of second trimester abortion. During a D&E procedure, the cervix is dilated, then the contents of the uterus are surgically removed. D&E procedures may occur in a hospital or in a clinic setting. We reviewed that some insurance plans cover a portion of the cost of the procedure while others do not. She indicated that her insurance company will not cover any of the costs of the procedure, which was stressful for her. We discussed funding options that are available from other organizations to assist in subsidizing the cost of the procedure.   We also reviewed [place] 's 72 hour consent law for termination procedures. Ms. Spohn indicated that she has already discussed termination with her OB provider and has set up an appointment to undergo the procedure later this week. We discussed that there are other clinics that offer termination services as well, but she indicated that she would be most comfortable undergoing the procedure with her OB provider.  Ms. Gossett requested additional resources on termination, termination funding organizations, and support resources. I sent Ms. Montilla an email with a link to the website A Heartbreaking Choice (aheartbreakingchoice.com) that supports families who have made  the difficult decision to terminate a wanted pregnancy. I also recommended the book A Time to Decide, A Time to Heal, which contains stories of families who had to make challenging decisions after receiving a prenatal diagnosis. I provided Ms. Stehl with a patient fact sheet on the dilation and evacuation procedure (D&E) from [employer]. I provided links to both the National [employer] and [employer] that provide financial assistance to individuals undergoing termination procedures. Finally, I provided Ms. Zaldivar with links to grief counseling and support resources offered through the [facility] and the [employer] [facility] [employer] organization.  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 I also validated that it was okay to mourn the loss of a wanted, normal pregnancy. Ms. Barrere was appropriately emotional about the diagnosis, but indicated that she has felt supported by her husband, parents, and care providers. We discussed that grief is not a linear process and that she will begin to heal at her own pace. I encouraged her to reach out if she has any additional questions or concerns prior to her termination procedure. Otherwise, I will call Ms. Mcham to discuss recurrence risks and options for future pregnancies as soon as I receive the karyotype results.  Buelah Manis, MS Genetic Counselor

## 2019-04-29 ENCOUNTER — Other Ambulatory Visit (HOSPITAL_COMMUNITY)
Admission: RE | Admit: 2019-04-29 | Discharge: 2019-04-29 | Disposition: A | Discharge status: Home / Self Care | Payer: BC Managed Care – PPO | Source: Ambulatory Visit | Attending: Obstetrics | Admitting: Obstetrics

## 2019-04-29 ENCOUNTER — Other Ambulatory Visit: Payer: Self-pay | Admitting: Obstetrics

## 2019-04-29 DIAGNOSIS — Q913 Trisomy 18, unspecified: Secondary | ICD-10-CM

## 2019-04-29 DIAGNOSIS — Z20828 Contact with and (suspected) exposure to other viral communicable diseases: Secondary | ICD-10-CM | POA: Diagnosis not present

## 2019-04-29 DIAGNOSIS — Z01812 Encounter for preprocedural laboratory examination: Secondary | ICD-10-CM | POA: Diagnosis present

## 2019-04-29 NOTE — Anesthesia Preprocedure Evaluation (Addendum)
Anesthesia Evaluation   Patient awake    Reviewed: Allergy & Precautions, Patient's Chart, lab work & pertinent test results  Airway Mallampati: II  TM Distance: >3 FB Neck ROM: Full    Dental no notable dental hx.    Pulmonary former smoker,  Quit smoking 2013   Pulmonary exam normal breath sounds clear to auscultation       Cardiovascular hypertension, Normal cardiovascular exam Rhythm:Regular Rate:Normal     Neuro/Psych  Headaches,    GI/Hepatic negative GI ROS, Neg liver ROS,   Endo/Other  negative endocrine ROS  Renal/GU negative Renal ROS  negative genitourinary   Musculoskeletal negative musculoskeletal ROS (+)   Abdominal   Peds  Hematology negative hematology ROS (+)   Anesthesia Other Findings   Reproductive/Obstetrics (+) Pregnancy 2nd trimester, Trisomy 18                                                             Anesthesia Evaluation  Patient identified by MRN, date of birth, ID band Patient awake    Reviewed: Allergy & Precautions, NPO status , Patient's Chart, lab work & pertinent test results  Airway Mallampati: II  TM Distance: >3 FB Neck ROM: Full    Dental  (+) Teeth Intact   Pulmonary neg pulmonary ROS, former smoker,    breath sounds clear to auscultation       Cardiovascular hypertension,  Rhythm:Regular Rate:Normal     Neuro/Psych  Headaches, negative psych ROS   GI/Hepatic negative GI ROS, Neg liver ROS,   Endo/Other  negative endocrine ROS  Renal/GU negative Renal ROS  negative genitourinary   Musculoskeletal negative musculoskeletal ROS (+)   Abdominal   Peds negative pediatric ROS (+)  Hematology negative hematology ROS (+)   Anesthesia Other Findings   Reproductive/Obstetrics (+) Pregnancy                             Lab Results  Component Value Date   WBC 9.4 01/09/2016   HGB 13.1  01/09/2016   HCT 37.7 01/09/2016   MCV 86.9 01/09/2016   PLT 229 01/09/2016   No results found for: INR, PROTIME   Anesthesia Physical Anesthesia Plan  ASA: II  Anesthesia Plan: Epidural   Post-op Pain Management:    Induction:   Airway Management Planned:   Additional Equipment:   Intra-op Plan:   Post-operative Plan:   Informed Consent: I have reviewed the patients History and Physical, chart, labs and discussed the procedure including the risks, benefits and alternatives for the proposed anesthesia with the patient or authorized representative who has indicated his/her understanding and acceptance.     Plan Discussed with:   Anesthesia Plan Comments:         Anesthesia Quick Evaluation  Anesthesia Physical Anesthesia Plan  ASA: II  Anesthesia Plan: General   Post-op Pain Management:    Induction: Intravenous  PONV Risk Score and Plan: 3 and Ondansetron, Dexamethasone, Midazolam and Treatment may vary due to age or medical condition  Airway Management Planned: LMA  Additional Equipment: None  Intra-op Plan:   Post-operative Plan: Extubation in OR  Informed Consent: I have reviewed the patients History and Physical, chart, labs and discussed the procedure including the risks, benefits and  alternatives for the proposed anesthesia with the patient or authorized representative who has indicated his/her understanding and acceptance.     Dental advisory given  Plan Discussed with: CRNA  Anesthesia Plan Comments:       Anesthesia Quick Evaluation

## 2019-04-30 ENCOUNTER — Other Ambulatory Visit: Payer: Self-pay | Admitting: Obstetrics

## 2019-04-30 ENCOUNTER — Other Ambulatory Visit: Payer: Self-pay

## 2019-04-30 ENCOUNTER — Encounter (HOSPITAL_BASED_OUTPATIENT_CLINIC_OR_DEPARTMENT_OTHER): Payer: Self-pay | Admitting: *Deleted

## 2019-04-30 DIAGNOSIS — Q913 Trisomy 18, unspecified: Secondary | ICD-10-CM

## 2019-04-30 LAB — NOVEL CORONAVIRUS, NAA (HOSP ORDER, SEND-OUT TO REF LAB; TAT 18-24 HRS): SARS-CoV-2, NAA: NOT DETECTED

## 2019-04-30 NOTE — Progress Notes (Signed)
Spoke with patient for pre op interview via telephone. NPO after MN. No medications AM of surgery. Patient will need T&S and CBC AM of surgery. Arrival time 0600.

## 2019-05-02 ENCOUNTER — Ambulatory Visit (HOSPITAL_COMMUNITY)
Admission: RE | Admit: 2019-05-02 | Discharge: 2019-05-02 | Disposition: A | Discharge status: Home / Self Care | Payer: BC Managed Care – PPO | Source: Ambulatory Visit | Attending: Obstetrics | Admitting: Obstetrics

## 2019-05-02 ENCOUNTER — Encounter (HOSPITAL_BASED_OUTPATIENT_CLINIC_OR_DEPARTMENT_OTHER): Payer: Self-pay | Admitting: Emergency Medicine

## 2019-05-02 ENCOUNTER — Other Ambulatory Visit: Payer: Self-pay

## 2019-05-02 ENCOUNTER — Ambulatory Visit (HOSPITAL_BASED_OUTPATIENT_CLINIC_OR_DEPARTMENT_OTHER): Payer: BC Managed Care – PPO | Admitting: Anesthesiology

## 2019-05-02 ENCOUNTER — Ambulatory Visit (HOSPITAL_BASED_OUTPATIENT_CLINIC_OR_DEPARTMENT_OTHER)
Admission: RE | Admit: 2019-05-02 | Discharge: 2019-05-02 | Disposition: A | Discharge status: Home / Self Care | Payer: BC Managed Care – PPO | Attending: Obstetrics | Admitting: Obstetrics

## 2019-05-02 ENCOUNTER — Encounter (HOSPITAL_BASED_OUTPATIENT_CLINIC_OR_DEPARTMENT_OTHER)
Admission: RE | Disposition: A | Discharge status: Home / Self Care | Payer: Self-pay | Source: Home / Self Care | Attending: Obstetrics

## 2019-05-02 DIAGNOSIS — O351XX0 Maternal care for (suspected) chromosomal abnormality in fetus, not applicable or unspecified: Secondary | ICD-10-CM | POA: Diagnosis not present

## 2019-05-02 DIAGNOSIS — Z3A15 15 weeks gestation of pregnancy: Secondary | ICD-10-CM | POA: Diagnosis not present

## 2019-05-02 DIAGNOSIS — O039 Complete or unspecified spontaneous abortion without complication: Secondary | ICD-10-CM | POA: Diagnosis not present

## 2019-05-02 DIAGNOSIS — Z79899 Other long term (current) drug therapy: Secondary | ICD-10-CM | POA: Insufficient documentation

## 2019-05-02 DIAGNOSIS — D259 Leiomyoma of uterus, unspecified: Secondary | ICD-10-CM | POA: Diagnosis not present

## 2019-05-02 DIAGNOSIS — O021 Missed abortion: Secondary | ICD-10-CM

## 2019-05-02 DIAGNOSIS — Z87891 Personal history of nicotine dependence: Secondary | ICD-10-CM | POA: Diagnosis not present

## 2019-05-02 DIAGNOSIS — Q913 Trisomy 18, unspecified: Secondary | ICD-10-CM

## 2019-05-02 DIAGNOSIS — O3412 Maternal care for benign tumor of corpus uteri, second trimester: Secondary | ICD-10-CM | POA: Insufficient documentation

## 2019-05-02 HISTORY — PX: OPERATIVE ULTRASOUND: SHX5996

## 2019-05-02 HISTORY — PX: DILATION AND EVACUATION: SHX1459

## 2019-05-02 LAB — CBC
HCT: 37.8 % (ref 36.0–46.0)
Hemoglobin: 12.7 g/dL (ref 12.0–15.0)
MCH: 30.8 pg (ref 26.0–34.0)
MCHC: 33.6 g/dL (ref 30.0–36.0)
MCV: 91.7 fL (ref 80.0–100.0)
Platelets: 201 10*3/uL (ref 150–400)
RBC: 4.12 MIL/uL (ref 3.87–5.11)
RDW: 12.5 % (ref 11.5–15.5)
WBC: 11.6 10*3/uL — ABNORMAL HIGH (ref 4.0–10.5)
nRBC: 0 % (ref 0.0–0.2)

## 2019-05-02 LAB — TYPE AND SCREEN
ABO/RH(D): A POS
Antibody Screen: NEGATIVE

## 2019-05-02 LAB — ABO/RH: ABO/RH(D): A POS

## 2019-05-02 SURGERY — DILATION AND EVACUATION, UTERUS, SECOND TRIMESTER
Anesthesia: General

## 2019-05-02 MED ORDER — TRANEXAMIC ACID-NACL 1000-0.7 MG/100ML-% IV SOLN
1000.0000 mg | INTRAVENOUS | Status: AC
  Administered 2019-05-02: 1000 mg via INTRAVENOUS
  Filled 2019-05-02 (×2): qty 100

## 2019-05-02 MED ORDER — PROPOFOL 10 MG/ML IV BOLUS
INTRAVENOUS | Status: DC | PRN
  Administered 2019-05-02: 200 mg via INTRAVENOUS

## 2019-05-02 MED ORDER — METHYLERGONOVINE MALEATE 0.2 MG PO TABS: 0.2000 mg | ORAL_TABLET | Freq: Four times a day (QID) | ORAL | 0 refills | Status: DC

## 2019-05-02 MED ORDER — ACETAMINOPHEN 500 MG PO TABS
1000.0000 mg | ORAL_TABLET | Freq: Once | ORAL | Status: AC
  Administered 2019-05-02: 07:00:00 1000 mg via ORAL
  Filled 2019-05-02: qty 2

## 2019-05-02 MED ORDER — KETOROLAC TROMETHAMINE 30 MG/ML IJ SOLN
INTRAMUSCULAR | Status: DC | PRN
  Administered 2019-05-02: 30 mg via INTRAVENOUS

## 2019-05-02 MED ORDER — LIDOCAINE 2% (20 MG/ML) 5 ML SYRINGE
INTRAMUSCULAR | Status: AC
  Filled 2019-05-02: qty 5

## 2019-05-02 MED ORDER — PHENYLEPHRINE 40 MCG/ML (10ML) SYRINGE FOR IV PUSH (FOR BLOOD PRESSURE SUPPORT)
PREFILLED_SYRINGE | INTRAVENOUS | Status: DC | PRN
  Administered 2019-05-02: 80 ug via INTRAVENOUS

## 2019-05-02 MED ORDER — OXYCODONE-ACETAMINOPHEN 5-325 MG PO TABS: 1.0000 | ORAL_TABLET | Freq: Four times a day (QID) | ORAL | 0 refills | Status: AC | PRN

## 2019-05-02 MED ORDER — PROMETHAZINE HCL 25 MG/ML IJ SOLN
6.2500 mg | INTRAMUSCULAR | Status: DC | PRN
  Filled 2019-05-02: qty 1

## 2019-05-02 MED ORDER — MIDAZOLAM HCL 5 MG/5ML IJ SOLN
INTRAMUSCULAR | Status: DC | PRN
  Administered 2019-05-02: 2 mg via INTRAVENOUS

## 2019-05-02 MED ORDER — HYDROMORPHONE HCL 1 MG/ML IJ SOLN
0.2500 mg | INTRAMUSCULAR | Status: DC | PRN
  Filled 2019-05-02: qty 0.5

## 2019-05-02 MED ORDER — FENTANYL CITRATE (PF) 100 MCG/2ML IJ SOLN
INTRAMUSCULAR | Status: DC | PRN
  Administered 2019-05-02: 50 ug via INTRAVENOUS

## 2019-05-02 MED ORDER — DEXAMETHASONE SODIUM PHOSPHATE 10 MG/ML IJ SOLN
INTRAMUSCULAR | Status: AC
  Filled 2019-05-02: qty 1

## 2019-05-02 MED ORDER — OXYTOCIN 10 UNIT/ML IJ SOLN
INTRAMUSCULAR | Status: AC
  Filled 2019-05-02: qty 2

## 2019-05-02 MED ORDER — METHYLERGONOVINE MALEATE 0.2 MG/ML IJ SOLN
INTRAMUSCULAR | Status: AC
  Filled 2019-05-02: qty 1

## 2019-05-02 MED ORDER — ONDANSETRON HCL 4 MG/2ML IJ SOLN
INTRAMUSCULAR | Status: DC | PRN
  Administered 2019-05-02: 4 mg via INTRAVENOUS

## 2019-05-02 MED ORDER — OXYCODONE HCL 5 MG PO TABS
ORAL_TABLET | ORAL | Status: AC
  Filled 2019-05-02: qty 1

## 2019-05-02 MED ORDER — CHLOROPROCAINE HCL 1 % IJ SOLN
INTRAMUSCULAR | Status: DC | PRN
  Administered 2019-05-02: 20 mL

## 2019-05-02 MED ORDER — KETOROLAC TROMETHAMINE 30 MG/ML IJ SOLN
30.0000 mg | Freq: Once | INTRAMUSCULAR | Status: DC | PRN
  Filled 2019-05-02: qty 1

## 2019-05-02 MED ORDER — IBUPROFEN 200 MG PO TABS: 800.0000 mg | ORAL_TABLET | Freq: Three times a day (TID) | ORAL | 1 refills | Status: DC | PRN

## 2019-05-02 MED ORDER — METHYLERGONOVINE MALEATE 0.2 MG/ML IJ SOLN
INTRAMUSCULAR | Status: DC | PRN
  Administered 2019-05-02: 0.2 mg via INTRAMUSCULAR

## 2019-05-02 MED ORDER — LACTATED RINGERS IV SOLN
INTRAVENOUS | Status: DC
  Administered 2019-05-02 (×2): via INTRAVENOUS
  Filled 2019-05-02: qty 1000

## 2019-05-02 MED ORDER — ACETAMINOPHEN 500 MG PO TABS
ORAL_TABLET | ORAL | Status: AC
  Filled 2019-05-02: qty 2

## 2019-05-02 MED ORDER — ONDANSETRON HCL 4 MG/2ML IJ SOLN
INTRAMUSCULAR | Status: AC
  Filled 2019-05-02: qty 2

## 2019-05-02 MED ORDER — ROCURONIUM BROMIDE 10 MG/ML (PF) SYRINGE
PREFILLED_SYRINGE | INTRAVENOUS | Status: AC
  Filled 2019-05-02: qty 10

## 2019-05-02 MED ORDER — MEPERIDINE HCL 25 MG/ML IJ SOLN
6.2500 mg | INTRAMUSCULAR | Status: DC | PRN
  Filled 2019-05-02: qty 1

## 2019-05-02 MED ORDER — PROPOFOL 10 MG/ML IV BOLUS
INTRAVENOUS | Status: AC
  Filled 2019-05-02: qty 40

## 2019-05-02 MED ORDER — MIDAZOLAM HCL 2 MG/2ML IJ SOLN
INTRAMUSCULAR | Status: AC
  Filled 2019-05-02: qty 2

## 2019-05-02 MED ORDER — LIDOCAINE 2% (20 MG/ML) 5 ML SYRINGE
INTRAMUSCULAR | Status: DC | PRN
  Administered 2019-05-02: 70 mg via INTRAVENOUS

## 2019-05-02 MED ORDER — DEXAMETHASONE SODIUM PHOSPHATE 10 MG/ML IJ SOLN
INTRAMUSCULAR | Status: DC | PRN
  Administered 2019-05-02: 10 mg via INTRAVENOUS

## 2019-05-02 MED ORDER — KETOROLAC TROMETHAMINE 30 MG/ML IJ SOLN
INTRAMUSCULAR | Status: AC
  Filled 2019-05-02: qty 1

## 2019-05-02 MED ORDER — FENTANYL CITRATE (PF) 100 MCG/2ML IJ SOLN
INTRAMUSCULAR | Status: AC
  Filled 2019-05-02: qty 2

## 2019-05-02 MED ORDER — SODIUM CHLORIDE 0.9 % IV SOLN
100.0000 mg | Freq: Two times a day (BID) | INTRAVENOUS | Status: DC
  Administered 2019-05-02: 100 mg via INTRAVENOUS
  Filled 2019-05-02 (×2): qty 100

## 2019-05-02 MED ORDER — OXYCODONE HCL 5 MG/5ML PO SOLN
5.0000 mg | Freq: Once | ORAL | Status: AC | PRN
  Filled 2019-05-02: qty 5

## 2019-05-02 MED ORDER — OXYCODONE HCL 5 MG PO TABS
5.0000 mg | ORAL_TABLET | Freq: Once | ORAL | Status: AC | PRN
  Administered 2019-05-02: 5 mg via ORAL
  Filled 2019-05-02: qty 1

## 2019-05-02 SURGICAL SUPPLY — 22 items
CATH ROBINSON RED A/P 16FR (CATHETERS) IMPLANT
CLOTH BEACON ORANGE TIMEOUT ST (SAFETY) ×3 IMPLANT
COVER WAND RF STERILE (DRAPES) ×3 IMPLANT
FILTER UTR ASPR ASSEMBLY (MISCELLANEOUS) ×3 IMPLANT
GLOVE BIO SURGEON STRL SZ 6.5 (GLOVE) ×4 IMPLANT
GLOVE BIO SURGEONS STRL SZ 6.5 (GLOVE) ×2
GOWN W/2 COTTON TOWELS 2 STD (GOWNS) ×3 IMPLANT
HOSE CONNECTING 18IN BERKELEY (TUBING) ×6 IMPLANT
KIT TURNOVER CYSTO (KITS) ×3 IMPLANT
MANIFOLD NEPTUNE II (INSTRUMENTS) IMPLANT
MOR PAK D E/D C/CER CERCL 1158 (SET/KITS/TRAYS/PACK) ×3 IMPLANT
PACK BASIN DAY SURGERY FS (CUSTOM PROCEDURE TRAY) ×3 IMPLANT
PAD OB MATERNITY 4.3X12.25 (PERSONAL CARE ITEMS) ×3 IMPLANT
SET BERKELEY SUCTION TUBING (SUCTIONS) ×6 IMPLANT
TOP DISP BERKELEY (MISCELLANEOUS) ×3 IMPLANT
TOWEL OR 17X26 10 PK STRL BLUE (TOWEL DISPOSABLE) ×3 IMPLANT
TRAY DSU PREP LF (CUSTOM PROCEDURE TRAY) ×3 IMPLANT
TUBE CONNECTING 12'X1/4 (SUCTIONS)
TUBE CONNECTING 12X1/4 (SUCTIONS) IMPLANT
VACURETTE 16MM ASPIR CVD .5 (CANNULA) ×3 IMPLANT
VACURETTE 8 RIGID CVD (CANNULA) ×3 IMPLANT
WATER STERILE IRR 500ML POUR (IV SOLUTION) ×3 IMPLANT

## 2019-05-02 NOTE — Transfer of Care (Signed)
Immediate Anesthesia Transfer of Care Note  Patient: Jacqueline Barber  Procedure(s) Performed: DILATATION AND EVACUATION (D&E) 2ND TRIMESTER (N/A ) OPERATIVE ULTRASOUND (N/A )  Patient Location: PACU  Anesthesia Type:General  Level of Consciousness: awake, alert  and oriented  Airway & Oxygen Therapy: Patient Spontanous Breathing and Patient connected to nasal cannula oxygen  Post-op Assessment: Report given to RN  Post vital signs: Reviewed and stable  Last Vitals:  Vitals Value Taken Time  BP 121/75 05/02/19 0937  Temp    Pulse 111 05/02/19 0939  Resp 16 05/02/19 0939  SpO2 99 % 05/02/19 0939  Vitals shown include unvalidated device data.  Last Pain:  Vitals:   05/02/19 0650  TempSrc: Oral  PainSc: 3       Patients Stated Pain Goal: 4 ([id number])  Complications: No apparent anesthesia complications

## 2019-05-02 NOTE — Anesthesia Postprocedure Evaluation (Signed)
Anesthesia Post Note  Patient: Jacqueline Barber  Procedure(s) Performed: DILATATION AND EVACUATION (D&E) 2ND TRIMESTER (N/A ) OPERATIVE ULTRASOUND (N/A )     Patient location during evaluation: PACU Anesthesia Type: General Level of consciousness: awake and alert Pain management: pain level controlled Vital Signs Assessment: post-procedure vital signs reviewed and stable Respiratory status: spontaneous breathing, nonlabored ventilation, respiratory function stable and patient connected to nasal cannula oxygen Cardiovascular status: blood pressure returned to baseline and stable Postop Assessment: no apparent nausea or vomiting Anesthetic complications: no    Last Vitals:  Vitals:   05/02/19 1006 05/02/19 1015  BP:  117/76  Pulse: 72 77  Resp: 11 14  Temp:    SpO2: 99% 94%    Last Pain:  Vitals:   05/02/19 1015  TempSrc:   PainSc: [place]

## 2019-05-02 NOTE — Op Note (Signed)
05/02/2019  10:21 AM  PATIENT:  Jacqueline Barber  33 y.o. female  PRE-OPERATIVE DIAGNOSIS:  Trisomy 36, Miscarriage verified by intraoperative ultrasound, uterine fibroids  POST-OPERATIVE DIAGNOSIS:  Trisomy 18, Miscarriage verified by intraoperative ultrasound, uterine fibroids  PROCEDURE:  Procedure(s): DILATATION AND EVACUATION (D&E) 2ND TRIMESTER (N/A) OPERATIVE ULTRASOUND (N/A)  SURGEON:  Surgeon(s) and Role:    * Aloha Gell, MD - Primary  PHYSICIAN ASSISTANT: None  ASSISTANTS: none   ANESTHESIA:   local, general and Quarter percent Nesacaine, 5 cc at 12:00 into the cervix.  10 cc at each 5 and 7:00 in the cervical paracervical junction  EBL:  300 mL   BLOOD ADMINISTERED:none  DRAINS: none   LOCAL MEDICATIONS USED:  OTHER see above  SPECIMEN:  Source of Specimen:  Products of conception  DISPOSITION OF SPECIMEN:  PATHOLOGY  COUNTS:  YES  TOURNIQUET:  * No tourniquets in log *  DICTATION: .Note written in [place]: Discharge to home after PACU  PATIENT DISPOSITION:  PACU - hemodynamically stable.   Delay start of Pharmacological VTE agent (>24hrs) due to surgical blood loss or risk of bleeding: yes    Antibiotic: 100 mg of IV doxycycline Findings: 20 weeks size uterus preprocedure (known large fibroids), decreased uterine size post procedure with adequate hemostasis. thin endometrial stripe at with no flow suggestive retained products of conception, fibroids.  Prior to surgery ultrasound confirmed no fetal heart tones.  Complications: None Indications: A 33 year old G3, P2 who presented for dilation and evacuation of pregnancy complicated by trisomy 80.  Patient was aware of the high risk of loss of fetal heart tones prior to surgery given the chromosomal abnormality, the documented fetal bradycardia in the office several days prior, and the bloodstained chorionic villous sampling. Risks benefits were discussed with patient who agreed to proceed.    Procedure: After informed was obtained the patient was taken to the operating room where general anesthesia was initiated without difficulty. She was prepped and draped in the normal fashion in dorsal supine lithotomy position.  We began with ultrasound which confirmed fibroid uterus, lack of fetal heart tones, pregnancy beginning to descend into the lower uterine segment.  A bimanual examination was performed to assess the size and the position of the uterus. A sterile speculum was placed into the vagina. A single-tooth tenaculum was used to grasp the anterior lip of the cervix after first infusing with 5 cc of anesthetic.  Fetal parts were seen coming from the cervical os.  The cervix was serially dilated with Kennon Rounds dilators to a #45. A 16 French suction curet was advanced past the internal os under ultrasound guidance. Suction curettage was carried out. Several passes were made until the uterine cavity was completely empty.  Ring forceps were used to remove fetal parts.  The torso with the spine and left leg were removed.  The right arm and chest were removed, the fetal head was removed.  The remainder of the fetal parts and placental tissue were removed via suction curettage.  A sharp curet was used to gently palpate the uterine walls to ensure a gritty cry.  This was all done under ultrasound guidance.  Endometrial stripe was quite difficult to evaluate given the large fundal fibroid.  More than usual bleeding was noted but this was controlled with 800 mcg of rectal Cytotec, 0.2 mg IM Methergine and decision was made to add IV trans-examix acid.  5 to 10 minutes of fundal massage was also done.  Hemostasis was noted and the decision was made to end the procedure. Ultrasound was used to confirm a thin endometrial stripe and no blood flow.  The uterus was not filling with blood.  The tenaculum was removed. Pressure was used to control the tenaculum site. Repeat bimanual examination was done to confirm good  uterine tone and good hemostasis. The procedure was then terminated. Patient tolerated the procedure well sponge lap and needle count were correct x3. Patient seems recovery room in stable condition.  Jacqueline Barber,Cliett A. 06/29/2012 10:53 AM

## 2019-05-02 NOTE — Discharge Instructions (Signed)

## 2019-05-02 NOTE — Addendum Note (Signed)
Addendum  created 05/02/19 1246 by Bonney Aid, CRNA   Charge Capture section accepted

## 2019-05-02 NOTE — H&P (Signed)
Chief complaint: Trisomy 67  History of present illness: 33 year old G3 P2-0-0-2 with current pregnancy complicated by early vanishing twin, slow fetal development then confirmation of trisomy 18 by cell free fetal DNA testing and then chorionic villous sampling.   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  This was a former induced pregnancy.  Known uterine fibroids.  Patient notes cramping since placement of Cytotec early this morning.  No bleeding.  No fevers  Past Medical History:  Diagnosis Date  . Gestational hypertension    previous pregnancy  . Headache   . Hemorrhoid   . No pertinent past medical history   . Postpartum care following vaginal delivery (06/27/12) M 06/28/2012  . Vaginal bleeding in pregnant patient at less than [redacted] weeks gestation 08/22/2014  . Vaginal Pap smear, abnormal     Past Surgical History:  Procedure Laterality Date  . 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  Medications: None  Physical exam: Vitals:   04/30/19 1533 05/02/19 0650  BP:  120/70  Pulse:  77  Resp:  16  Temp:  98 F (36.7 C)  TempSrc:  Oral  SpO2:  99%  Weight: 68 kg 68.5 kg  Height: 5\' 2"  (1.575 m) 5\' 2"  (1.575 m)   General: Well-appearing, no distress Abdomen: Soft, nontender GU: Deferred to the OR Lower extremity: Nontender, no edema  CBC    Component Value Date/Time   WBC 11.6 (H) 05/02/2019 0638   RBC 4.12 05/02/2019 0638   HGB 12.7 05/02/2019 0638   HCT 37.8 05/02/2019 0638   PLT 201 05/02/2019 0638   MCV 91.7 05/02/2019 0638   MCH 30.8 05/02/2019 0638   MCHC 33.6 05/02/2019 0638   RDW 12.5 05/02/2019 0638   LYMPHSABS 0.3 (L) 01/09/2016 1617   MONOABS 0.3 01/09/2016 1617   EOSABS 0.4 01/09/2016 1617   BASOSABS 0.0 01/09/2016 1617    Rh+  Assessment and plan: 33 year old G3 P2-0-0-2 at 15 weeks and 5 days with pregnancy complicated by trisomy 33.  After  multiple discussions patient has elected to proceed with termination of pregnancy.  We will plan to do with ultrasound guidance given her multiple uterine fibroids.  Patient is aware of risks of bleeding, infection, damage to surrounding organs.  Long-term fertility implications discussed with patient.  Doxycycline for antibiotic prophylaxis.  Jacqueline Barber 05/02/2019 8:22 AM

## 2019-05-02 NOTE — Anesthesia Procedure Notes (Signed)
Procedure Name: LMA Insertion Date/Time: 05/02/2019 8:38 AM Performed by: Bonney Aid, CRNA Pre-anesthesia Checklist: Patient identified, Emergency Drugs available, Suction available and Patient being monitored Patient Re-evaluated:Patient Re-evaluated prior to induction Oxygen Delivery Method: Circle system utilized Preoxygenation: Pre-oxygenation with 100% oxygen Induction Type: IV induction Ventilation: Mask ventilation without difficulty LMA: LMA inserted LMA Size: 4.0 Number of attempts: 1 Airway Equipment and Method: Bite block Placement Confirmation: positive ETCO2 Tube secured with: Tape Dental Injury: Teeth and Oropharynx as per pre-operative assessment

## 2019-05-02 NOTE — Brief Op Note (Signed)
05/02/2019  10:21 AM  PATIENT:  Jacqueline Barber  33 y.o. female  PRE-OPERATIVE DIAGNOSIS:  Trisomy 30, Miscarriage verified by intraoperative ultrasound, uterine fibroids  POST-OPERATIVE DIAGNOSIS:  Trisomy 18, Miscarriage verified by intraoperative ultrasound, uterine fibroids  PROCEDURE:  Procedure(s): DILATATION AND EVACUATION (D&E) 2ND TRIMESTER (N/A) OPERATIVE ULTRASOUND (N/A)  SURGEON:  Surgeon(s) and Role:    * Aloha Gell, MD - Primary  PHYSICIAN ASSISTANT: None  ASSISTANTS: none   ANESTHESIA:   local, general and Quarter percent Nesacaine, 5 cc at 12:00 into the cervix.  10 cc at each 5 and 7:00 in the cervical paracervical junction  EBL:  300 mL   BLOOD ADMINISTERED:none  DRAINS: none   LOCAL MEDICATIONS USED:  OTHER see above  SPECIMEN:  Source of Specimen:  Products of conception  DISPOSITION OF SPECIMEN:  PATHOLOGY  COUNTS:  YES  TOURNIQUET:  * No tourniquets in log *  DICTATION: .Note written in [place]: Discharge to home after PACU  PATIENT DISPOSITION:  PACU - hemodynamically stable.   Delay start of Pharmacological VTE agent (>24hrs) due to surgical blood loss or risk of bleeding: yes

## 2019-05-05 ENCOUNTER — Encounter (HOSPITAL_BASED_OUTPATIENT_CLINIC_OR_DEPARTMENT_OTHER): Payer: Self-pay | Admitting: Obstetrics

## 2019-05-05 LAB — SURGICAL PATHOLOGY

## 2019-05-06 ENCOUNTER — Telehealth (HOSPITAL_COMMUNITY): Payer: Self-pay | Admitting: Genetic Counselor

## 2019-05-06 NOTE — Telephone Encounter (Signed)
I called Jacqueline Barber to discuss her positive chorionic villus sampling (CVS) results for trisomy 18. 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 See previous phone note that discussed features associated with trisomy 21.  Jacqueline Barber was counseled that this final result indicates that trisomy 18 likely occurred randomly due to an error in chromosomal division during the formation of reproductive cells in a process called nondisjunction. The recurrence risk for trisomy 44 in a future pregnancy is increased 2.5-fold over her age-related risk, and recurrence for a different viable trisomy in a future pregnancy is increased 1.6-fold over her age-related risk Molli Posey al., 2004). This indicates that Jacqueline Barber has a 1-2% chance for recurrence of a chromosomal trisomy in future pregnancies. We reviewed that she would likely be referred for genetic counseling in the future to discuss screening/testing options that would be available during pregnancy should she be interested in pursuing them.  Per records, Jacqueline Barber underwent a dilation and evacuation (D&E) procedure last week. She reported that the procedure went well, but that she was experiencing more pain today than she has previously. She took some pain medications and has been spending time resting. She indicated that the support resources I emailed to her last week were helpful and that she continues to review them as she begins her healing process.   Jacqueline Barber indicated that she had no further questions about these results at this time. I encouraged her to contact me via phone or email should any questions or concerns arise.  Jacqueline Manis, MS Genetic Counselor

## 2019-05-06 NOTE — Telephone Encounter (Deleted)
Note below contains error. First paragraph should read as follows:  "I called Ms. Thurmon to discuss her [place] result that is positive for trisomy 53. No numerical abnormalities were identified for chromosomes X, Y, 13,or21, reducing the likelihood of trisomy 43, trisomy 104, and monosomy X in the fetus. I reminded Ms. Henn while FISH results are not considered diagnostic, this result is consistent with results from [facility]. We discussed that fetal karyotype on the CVS sample is still being completed. While it is expected that karyotype will confirm the results from [facility], there is the possibility of something else being identified on karyotype, such as a chromosomal rearrangement.It is recommended that individuals wait to receive final results from karyotype to make a decision about continuation versus termination of a pregnancy."

## 2019-05-12 ENCOUNTER — Other Ambulatory Visit (HOSPITAL_COMMUNITY): Payer: Self-pay | Admitting: Obstetrics

## 2019-05-15 LAB — CHROMOSOME CVS RFX CMA
Cells Analyzed: 20
Cells Counted: 33
Cells Karyotyped: 2
GTG Band Resolution: 400

## 2019-05-15 LAB — FISH, CVS PRENATAL ANEUPLOID
Cells Analyzed: 50
Cells Counted: 50

## 2019-05-15 LAB — MCC TRACKING

## 2019-05-16 LAB — MATERNAL CELL CONTAMINATION

## 2019-08-15 NOTE — L&D Delivery Note (Signed)
Delivery Note At 3:33 AM a viable female was delivered via Vaginal, Spontaneous (Presentation: Right Occiput Anterior).  APGAR: 9, 9; weight pending  .   Placenta status: Spontaneous, Intact.  Cord: 3 vessels with the following complications: None.  Cord pH: n/a  Anesthesia: Epidural Episiotomy: None Lacerations: 1st degree;Perineal Suture Repair: 3.0 Est. Blood Loss (mL):    Mom to postpartum.  Baby to Couplet care / Skin to Skin.  Maggie Senseney A Yerik Zeringue 04/01/2020, 3:55 AM

## 2019-11-03 ENCOUNTER — Ambulatory Visit: Payer: BC Managed Care – PPO

## 2019-11-20 LAB — OB RESULTS CONSOLE GBS: GBS: NEGATIVE

## 2020-03-31 ENCOUNTER — Other Ambulatory Visit: Payer: Self-pay

## 2020-03-31 ENCOUNTER — Inpatient Hospital Stay (HOSPITAL_COMMUNITY)
Admission: AD | Admit: 2020-03-31 | Discharge: 2020-04-02 | DRG: 807 | Disposition: A | Discharge status: Home / Self Care | Payer: BC Managed Care – PPO | Attending: Obstetrics | Admitting: Obstetrics

## 2020-03-31 ENCOUNTER — Encounter (HOSPITAL_COMMUNITY): Payer: Self-pay | Admitting: Obstetrics and Gynecology

## 2020-03-31 DIAGNOSIS — O4292 Full-term premature rupture of membranes, unspecified as to length of time between rupture and onset of labor: Secondary | ICD-10-CM | POA: Diagnosis present

## 2020-03-31 DIAGNOSIS — O329XX0 Maternal care for malpresentation of fetus, unspecified, not applicable or unspecified: Secondary | ICD-10-CM

## 2020-03-31 DIAGNOSIS — Z20822 Contact with and (suspected) exposure to covid-19: Secondary | ICD-10-CM | POA: Diagnosis present

## 2020-03-31 DIAGNOSIS — Z3A39 39 weeks gestation of pregnancy: Secondary | ICD-10-CM

## 2020-03-31 DIAGNOSIS — O26893 Other specified pregnancy related conditions, third trimester: Secondary | ICD-10-CM | POA: Diagnosis present

## 2020-03-31 DIAGNOSIS — Z87891 Personal history of nicotine dependence: Secondary | ICD-10-CM | POA: Diagnosis not present

## 2020-03-31 DIAGNOSIS — D259 Leiomyoma of uterus, unspecified: Secondary | ICD-10-CM | POA: Diagnosis present

## 2020-03-31 DIAGNOSIS — O3413 Maternal care for benign tumor of corpus uteri, third trimester: Secondary | ICD-10-CM | POA: Diagnosis present

## 2020-03-31 LAB — CBC
HCT: 40.4 % (ref 36.0–46.0)
Hemoglobin: 13.4 g/dL (ref 12.0–15.0)
MCH: 30.6 pg (ref 26.0–34.0)
MCHC: 33.2 g/dL (ref 30.0–36.0)
MCV: 92.2 fL (ref 80.0–100.0)
Platelets: 212 10*3/uL (ref 150–400)
RBC: 4.38 MIL/uL (ref 3.87–5.11)
RDW: 13.1 % (ref 11.5–15.5)
WBC: 11.6 10*3/uL — ABNORMAL HIGH (ref 4.0–10.5)
nRBC: 0 % (ref 0.0–0.2)

## 2020-03-31 LAB — TYPE AND SCREEN
ABO/RH(D): A POS
Antibody Screen: NEGATIVE

## 2020-03-31 LAB — SARS CORONAVIRUS 2 BY RT PCR (HOSPITAL ORDER, PERFORMED IN ~~LOC~~ HOSPITAL LAB): SARS Coronavirus 2: NEGATIVE

## 2020-03-31 LAB — POCT FERN TEST: POCT Fern Test: POSITIVE

## 2020-03-31 MED ORDER — OXYTOCIN BOLUS FROM INFUSION
333.0000 mL | Freq: Once | INTRAVENOUS | Status: AC
  Administered 2020-04-01: 333 mL via INTRAVENOUS

## 2020-03-31 MED ORDER — LIDOCAINE HCL (PF) 1 % IJ SOLN: 30.0000 mL | INTRAMUSCULAR | Status: DC | PRN

## 2020-03-31 MED ORDER — ONDANSETRON HCL 4 MG/2ML IJ SOLN: 4.0000 mg | Freq: Four times a day (QID) | INTRAMUSCULAR | Status: DC | PRN

## 2020-03-31 MED ORDER — OXYTOCIN-SODIUM CHLORIDE 30-0.9 UT/500ML-% IV SOLN
2.5000 [IU]/h | INTRAVENOUS | Status: DC
  Administered 2020-04-01: 2.5 [IU]/h via INTRAVENOUS
  Filled 2020-03-31: qty 500

## 2020-03-31 MED ORDER — LACTATED RINGERS IV SOLN: 500.0000 mL | Freq: Once | INTRAVENOUS | Status: DC

## 2020-03-31 MED ORDER — OXYTOCIN-SODIUM CHLORIDE 30-0.9 UT/500ML-% IV SOLN
1.0000 m[IU]/min | INTRAVENOUS | Status: DC
  Administered 2020-03-31: 2 m[IU]/min via INTRAVENOUS

## 2020-03-31 MED ORDER — ACETAMINOPHEN 325 MG PO TABS: 650.0000 mg | ORAL_TABLET | ORAL | Status: DC | PRN

## 2020-03-31 MED ORDER — PHENYLEPHRINE 40 MCG/ML (10ML) SYRINGE FOR IV PUSH (FOR BLOOD PRESSURE SUPPORT): 80.0000 ug | PREFILLED_SYRINGE | INTRAVENOUS | Status: DC | PRN

## 2020-03-31 MED ORDER — OXYCODONE-ACETAMINOPHEN 5-325 MG PO TABS: 2.0000 | ORAL_TABLET | ORAL | Status: DC | PRN

## 2020-03-31 MED ORDER — DIPHENHYDRAMINE HCL 50 MG/ML IJ SOLN: 12.5000 mg | INTRAMUSCULAR | Status: DC | PRN

## 2020-03-31 MED ORDER — TERBUTALINE SULFATE 1 MG/ML IJ SOLN: 0.2500 mg | Freq: Once | INTRAMUSCULAR | Status: DC | PRN

## 2020-03-31 MED ORDER — EPHEDRINE 5 MG/ML INJ: 10.0000 mg | INTRAVENOUS | Status: DC | PRN

## 2020-03-31 MED ORDER — LACTATED RINGERS IV SOLN
INTRAVENOUS | Status: DC
  Administered 2020-03-31: 125 mL via INTRAVENOUS

## 2020-03-31 MED ORDER — FENTANYL-BUPIVACAINE-NACL 0.5-0.125-0.9 MG/250ML-% EP SOLN
12.0000 mL/h | EPIDURAL | Status: DC | PRN
  Filled 2020-03-31: qty 250

## 2020-03-31 MED ORDER — SOD CITRATE-CITRIC ACID 500-334 MG/5ML PO SOLN: 30.0000 mL | ORAL | Status: DC | PRN

## 2020-03-31 MED ORDER — OXYCODONE-ACETAMINOPHEN 5-325 MG PO TABS: 1.0000 | ORAL_TABLET | ORAL | Status: DC | PRN

## 2020-03-31 MED ORDER — LACTATED RINGERS IV SOLN: 500.0000 mL | INTRAVENOUS | Status: DC | PRN

## 2020-03-31 NOTE — H&P (Signed)
Jacqueline Barber is a 34 y.o. female presenting for SROM. Reports LOF starting at 7:30PM initially clear in color, changed to pink-tinged. Irregular contractions prior to ROM, but no painful or consistent pattern. No VB. Good FM.    OB History    Gravida  4   Para  2   Term  2   Preterm  0   AB  1   Living  2     SAB  1   TAB  0   Ectopic  0   Multiple  0   Live Births  2          Past Medical History:  Diagnosis Date  . Gestational hypertension    previous pregnancy  . Headache   . Hemorrhoid   . No pertinent past medical history   . Postpartum care following vaginal delivery (06/27/12) M 06/28/2012  . Vaginal bleeding in pregnant patient at less than [redacted] weeks gestation 08/22/2014  . Vaginal Pap smear, abnormal    Past Surgical History:  Procedure Laterality Date  . DILATION AND EVACUATION N/A 05/02/2019   Procedure: DILATATION AND EVACUATION (D&E) 2ND TRIMESTER;  Surgeon: Aloha Gell, MD;  Location: [facility];  Service: Gynecology;  Laterality: N/A;  . NO PAST SURGERIES    . OPERATIVE ULTRASOUND N/A 05/02/2019   Procedure: OPERATIVE ULTRASOUND;  Surgeon: Aloha Gell, MD;  [facility];  Service: Gynecology;  Laterality: N/A;   Family History: family history includes Cancer in her brother; Diabetes in her paternal grandfather; Hyperlipidemia in her father; Hypertension in her father, maternal grandfather, maternal grandmother, and mother; Migraines in her father; Other in her brother; Urolithiasis in her father and mother. Social History:  reports that she quit smoking about 8 years ago. Her smoking use included cigarettes. She has never used smokeless tobacco. She reports current alcohol use of about 3.0 standard drinks of alcohol per week. She reports that she does not use drugs.     Maternal Diabetes: No Genetic Screening: Normal Maternal Ultrasounds/Referrals: Normal Growth [place] @ 35w EFW 30% ant placenta Fetal  Ultrasounds or other Referrals:  None Maternal Substance Abuse:  No Significant Maternal Medications:  None Significant Maternal Lab Results:  Group B Strep negative Other Comments:  Uterine Fibroids: Right lateral 7.5x5.1cm and 5.8x5.2cm   Review of Systems  Eyes: Positive for redness.  All other systems reviewed and are negative.  Maternal Medical History:  Reason for admission: Rupture of membranes.   Contractions: Frequency: irregular.   Perceived severity is mild.    Fetal activity: Perceived fetal activity is normal.    Prenatal complications: no prenatal complications Prenatal Complications - Diabetes: none.      Blood pressure 131/82, pulse 80, temperature 98 F (36.7 C), temperature source Oral, resp. rate 18, height 5\' 2"  (1.575 m), weight 78.7 kg, unknown if currently breastfeeding. Maternal Exam:  Uterine Assessment: Contraction duration is 60 seconds. Contraction frequency is irregular.   Abdomen: Patient reports no abdominal tenderness. Fetal presentation: vertex  Introitus: Normal vulva. Ferning test: positive.  Amniotic fluid character: clear.  Cervix: Cervix evaluated by digital exam.     Fetal Exam Fetal Monitor Review: Baseline rate: 140.  Variability: moderate (6-25 bpm).   Pattern: accelerations present and no decelerations.    Fetal State Assessment: Category I - tracings are normal.     Physical Exam Vitals reviewed.  Constitutional:      Appearance: Normal appearance.  HENT:     Head: Normocephalic.  Cardiovascular:     Rate and Rhythm: Normal rate and regular rhythm.     Pulses: Normal pulses.  Pulmonary:     Effort: Pulmonary effort is normal.  Genitourinary:    General: Normal vulva.  Musculoskeletal:        General: Normal range of motion.     Cervical back: Normal range of motion.  Skin:    General: Skin is warm.  Neurological:     General: No focal deficit present.     Mental Status: She is alert and oriented to person,  place, and time.  Psychiatric:        Mood and Affect: Mood normal.        Behavior: Behavior normal.     Prenatal labs: ABO, Rh: --/--/A POS (08/18 2117) Antibody: NEG (08/18 2117) Rubella:   Immune RPR:   NR HBsAg:   Neg HIV:    NR GBS:   NEG  Assessment/Plan: 34Y  [id number] @ 39.1 with SROM early labor GBS-/Rh+/RI  -Admit to LD for initiation of labor mgmt -Stat admission labs: CBC/TS -Continuous fetal monitoring w/ toco -Cat I tracing -SROM without regular ctxs, will start augmentation with Pitocin 2x2 titrate per protocol -Patient may have epidural upon request -Clear liquid diet -Known uterine fibroids, PPH precautions -Routine intrapartum care, anticipate delivery   Jacqueline Barber 03/31/2020, 10:25 PM

## 2020-03-31 NOTE — MAU Provider Note (Signed)
Pt informed that the ultrasound is considered a limited OB ultrasound and is not intended to be a complete ultrasound exam.  Patient also informed that the ultrasound is not being completed with the intent of assessing for fetal or placental anomalies or any pelvic abnormalities.  Explained that the purpose of today's ultrasound is to assess for  presentation (VTX).  Patient acknowledges the purpose of the exam and the limitations of the study.    Clarisa Fling, NP  9:16 PM 03/31/2020

## 2020-03-31 NOTE — MAU Note (Signed)
PT SAYS SROM AT 730PM- CLEAR FLUID,  IN OFFICE -  3 CM-  TODAY .  DENIES HSV AND MRSA. GBS-  NEG.  SOME UC'S.

## 2020-04-01 ENCOUNTER — Inpatient Hospital Stay (HOSPITAL_COMMUNITY): Payer: BC Managed Care – PPO | Admitting: Anesthesiology

## 2020-04-01 ENCOUNTER — Encounter (HOSPITAL_COMMUNITY): Payer: Self-pay | Admitting: Obstetrics and Gynecology

## 2020-04-01 LAB — CBC
HCT: 37.5 % (ref 36.0–46.0)
Hemoglobin: 12.6 g/dL (ref 12.0–15.0)
MCH: 31 pg (ref 26.0–34.0)
MCHC: 33.6 g/dL (ref 30.0–36.0)
MCV: 92.4 fL (ref 80.0–100.0)
Platelets: 180 10*3/uL (ref 150–400)
RBC: 4.06 MIL/uL (ref 3.87–5.11)
RDW: 13 % (ref 11.5–15.5)
WBC: 15.1 10*3/uL — ABNORMAL HIGH (ref 4.0–10.5)
nRBC: 0 % (ref 0.0–0.2)

## 2020-04-01 MED ORDER — ACETAMINOPHEN 325 MG PO TABS: 650.0000 mg | ORAL_TABLET | ORAL | Status: DC | PRN

## 2020-04-01 MED ORDER — ZOLPIDEM TARTRATE 5 MG PO TABS: 5.0000 mg | ORAL_TABLET | Freq: Every evening | ORAL | Status: DC | PRN

## 2020-04-01 MED ORDER — SIMETHICONE 80 MG PO CHEW: 80.0000 mg | CHEWABLE_TABLET | ORAL | Status: DC | PRN

## 2020-04-01 MED ORDER — DIBUCAINE (PERIANAL) 1 % EX OINT: 1.0000 "application " | TOPICAL_OINTMENT | CUTANEOUS | Status: DC | PRN

## 2020-04-01 MED ORDER — SENNOSIDES-DOCUSATE SODIUM 8.6-50 MG PO TABS
2.0000 | ORAL_TABLET | ORAL | Status: DC
  Administered 2020-04-02: 2 via ORAL
  Filled 2020-04-01: qty 2

## 2020-04-01 MED ORDER — COCONUT OIL OIL: 1.0000 "application " | TOPICAL_OIL | Status: DC | PRN

## 2020-04-01 MED ORDER — LIDOCAINE HCL (PF) 1 % IJ SOLN
INTRAMUSCULAR | Status: DC | PRN
  Administered 2020-04-01: 10 mL via EPIDURAL

## 2020-04-01 MED ORDER — SODIUM CHLORIDE (PF) 0.9 % IJ SOLN
INTRAMUSCULAR | Status: DC | PRN
  Administered 2020-04-01: 12 mL/h via EPIDURAL

## 2020-04-01 MED ORDER — IBUPROFEN 600 MG PO TABS
600.0000 mg | ORAL_TABLET | Freq: Four times a day (QID) | ORAL | Status: DC
  Administered 2020-04-01 – 2020-04-02 (×6): 600 mg via ORAL
  Filled 2020-04-01 (×6): qty 1

## 2020-04-01 MED ORDER — DIPHENHYDRAMINE HCL 25 MG PO CAPS: 25.0000 mg | ORAL_CAPSULE | Freq: Four times a day (QID) | ORAL | Status: DC | PRN

## 2020-04-01 MED ORDER — WITCH HAZEL-GLYCERIN EX PADS: 1.0000 "application " | MEDICATED_PAD | CUTANEOUS | Status: DC | PRN

## 2020-04-01 MED ORDER — PRENATAL MULTIVITAMIN CH
1.0000 | ORAL_TABLET | Freq: Every day | ORAL | Status: DC
  Administered 2020-04-01 – 2020-04-02 (×2): 1 via ORAL
  Filled 2020-04-01 (×2): qty 1

## 2020-04-01 MED ORDER — ONDANSETRON HCL 4 MG PO TABS: 4.0000 mg | ORAL_TABLET | ORAL | Status: DC | PRN

## 2020-04-01 MED ORDER — ONDANSETRON HCL 4 MG/2ML IJ SOLN: 4.0000 mg | INTRAMUSCULAR | Status: DC | PRN

## 2020-04-01 MED ORDER — BENZOCAINE-MENTHOL 20-0.5 % EX AERO
1.0000 "application " | INHALATION_SPRAY | CUTANEOUS | Status: DC | PRN
  Administered 2020-04-01: 1 via TOPICAL
  Filled 2020-04-01: qty 56

## 2020-04-01 NOTE — Anesthesia Postprocedure Evaluation (Signed)
Anesthesia Post Note  Patient: Jacqueline Barber  Procedure(s) Performed: AN AD HOC LABOR EPIDURAL     Patient location during evaluation: Mother Baby Anesthesia Type: Epidural Level of consciousness: awake Pain management: satisfactory to patient Vital Signs Assessment: post-procedure vital signs reviewed and stable Respiratory status: spontaneous breathing Cardiovascular status: stable Anesthetic complications: no   No complications documented.  Last Vitals:  Vitals:   04/01/20 0717 04/01/20 1215  BP: 126/70 114/79  Pulse: 87 80  Resp: 18 17  Temp: 36.9 C (!) 36.3 C  SpO2: 100% 100%    Last Pain:  Vitals:   04/01/20 1215  TempSrc: Oral  PainSc: 0-No pain   Pain Goal:                   [employer]

## 2020-04-01 NOTE — Anesthesia Procedure Notes (Signed)
Epidural Patient location during procedure: OB Start time: 04/01/2020 1:33 AM End time: 04/01/2020 1:42 AM  Staffing Anesthesiologist: Lidia Collum, MD Performed: anesthesiologist   Preanesthetic Checklist Completed: patient identified, IV checked, risks and benefits discussed, monitors and equipment checked, pre-op evaluation and timeout performed  Epidural Patient position: sitting Prep: DuraPrep Patient monitoring: heart rate, continuous pulse ox and blood pressure Approach: midline Location: L3-L4 Injection technique: LOR air  Needle:  Needle type: Tuohy  Needle gauge: 17 G Needle length: 9 cm Needle insertion depth: 6 cm Catheter type: closed end flexible Catheter size: 19 Gauge Catheter at skin depth: 11 cm Test dose: negative  Assessment Events: blood not aspirated, injection not painful, no injection resistance, no paresthesia and negative IV test  Additional Notes Reason for block:procedure for pain

## 2020-04-01 NOTE — Progress Notes (Signed)
MOB was referred for history of depression/anxiety. * Referral screened out by Clinical Social Worker because none of the following criteria appear to apply: ~ History of anxiety/depression during this pregnancy, or of post-partum depression following prior delivery. Per further chart review, it is noted that MOB's anxiety only occurs during flying, no other mentions of anxiety in MOB's chart.  ~ Diagnosis of anxiety and/or depression within last 3 years OR * MOB's symptoms currently being treated with medication and/or therapy.    Please contact the Clinical Social Worker if needs arise, by [facility] request, or if MOB scores greater than 9/yes to question 10 on Edinburgh Postpartum Depression Screen.   Virgie Dad Justin Meisenheimer, MSW, LCSW [facility] [place] [facility] [place] [phone]

## 2020-04-01 NOTE — Anesthesia Preprocedure Evaluation (Signed)
Anesthesia Evaluation  Patient identified by MRN, date of birth, ID band Patient awake    Reviewed: Allergy & Precautions, H&P , NPO status , Patient's Chart, lab work & pertinent test results  History of Anesthesia Complications Negative for: history of anesthetic complications  Airway Mallampati: II  TM Distance: >3 FB Neck ROM: full    Dental no notable dental hx.    Pulmonary neg pulmonary ROS, former smoker,    Pulmonary exam normal        Cardiovascular hypertension, negative cardio ROS Normal cardiovascular exam Rhythm:regular Rate:Normal     Neuro/Psych negative neurological ROS  negative psych ROS   GI/Hepatic negative GI ROS, Neg liver ROS,   Endo/Other  negative endocrine ROS  Renal/GU negative Renal ROS  negative genitourinary   Musculoskeletal   Abdominal   Peds  Hematology negative hematology ROS (+)   Anesthesia Other Findings   Reproductive/Obstetrics (+) Pregnancy                             Anesthesia Physical Anesthesia Plan  ASA: II  Anesthesia Plan: Epidural   Post-op Pain Management:    Induction:   PONV Risk Score and Plan:   Airway Management Planned:   Additional Equipment:   Intra-op Plan:   Post-operative Plan:   Informed Consent: I have reviewed the patients History and Physical, chart, labs and discussed the procedure including the risks, benefits and alternatives for the proposed anesthesia with the patient or authorized representative who has indicated his/her understanding and acceptance.       Plan Discussed with:   Anesthesia Plan Comments:         Anesthesia Quick Evaluation

## 2020-04-02 MED ORDER — IBUPROFEN 600 MG PO TABS
600.0000 mg | ORAL_TABLET | Freq: Four times a day (QID) | ORAL | 0 refills | Status: DC
Start: 2020-04-02 — End: 2021-03-15

## 2020-04-02 MED ORDER — COCONUT OIL OIL: 1.0000 "application " | TOPICAL_OIL | 0 refills | Status: DC | PRN

## 2020-04-02 MED ORDER — ACETAMINOPHEN 325 MG PO TABS
650.0000 mg | ORAL_TABLET | ORAL | 1 refills | Status: DC | PRN
Start: 2020-04-02 — End: 2021-03-15

## 2020-04-02 NOTE — Discharge Summary (Signed)
OB Discharge Summary  Patient Name: Jacqueline Barber DOB: [birth year] MRN: [id number]  Date of admission: 03/31/2020 Delivering provider: LAW, CASSANDRA A   Admitting diagnosis: Full-term premature rupture of membranes, unspecified duration to onset of labor [O42.92] Intrauterine pregnancy: [redacted]w[redacted]d     Secondary diagnosis: Patient Active Problem List   Diagnosis Date Noted  . SVD (spontaneous vaginal delivery) 04/01/2020  . Postpartum care following vaginal delivery (8/19) 04/01/2020  . First degree perineal laceration 04/01/2020  . Full-term premature rupture of membranes 03/31/2020  . Cephalic version 40/03/6760   Additional problems: Uterine fibroids   Date of discharge: 04/02/2020   Discharge diagnosis: Principal Problem:   Postpartum care following vaginal delivery (8/19) Active Problems:   Full-term premature rupture of membranes   SVD (spontaneous vaginal delivery)   First degree perineal laceration                                                            Post partum procedures:None  Augmentation: Pitocin Pain control: Epidural  Laceration:1st degree;Perineal  Episiotomy:None  Complications: None  Hospital course:  Onset of Labor With Vaginal Delivery      34 y.o. yo [id number] at [redacted]w[redacted]d was admitted in Latent Labor on 03/31/2020. Patient had an uncomplicated labor course as follows:  Membrane Rupture Time/Date:  ,   Delivery Method:Vaginal, Spontaneous  Episiotomy: None  Lacerations:  1st degree;Perineal  Patient had an uncomplicated postpartum course.  She is ambulating, tolerating a regular diet, passing flatus, and urinating well. Patient is discharged home in stable condition on 04/02/20.  Newborn Data: Birth date:04/01/2020  Birth time:3:33 AM  Gender:Female  Living status:Living  Apgars:9 ,9  Weight:3294 g   Physical exam  Vitals:   04/01/20 1215 04/01/20 1653 04/01/20 2213 04/02/20 0500  BP: 114/79 114/74 110/82 104/79  Pulse: 80 63 68 74  Resp: 17 18 18 19    Temp: (!) 97.4 F (36.3 C) 98 F (36.7 C) (!) 97.4 F (36.3 C) 97.8 F (36.6 C)  TempSrc: Oral Oral Oral Oral  SpO2: 100% 100% 100% 100%  Weight:      Height:       General: alert, cooperative and no distress Lochia: appropriate Uterine Fundus: firm Perineum: repair intact, no edema DVT Evaluation: No evidence of DVT seen on physical exam. Labs: Lab Results  Component Value Date   WBC 15.1 (H) 04/01/2020   HGB 12.6 04/01/2020   HCT 37.5 04/01/2020   MCV 92.4 04/01/2020   PLT 180 04/01/2020   CMP Latest Ref Rng & Units 01/09/2016  Glucose 65 - 99 mg/dL 94  BUN 6 - 20 mg/dL 16  Creatinine 0.44 - 1.00 mg/dL 0.84  Sodium 135 - 145 mmol/L 133(L)  Potassium 3.5 - 5.1 mmol/L 3.5  Chloride 101 - 111 mmol/L 103  CO2 22 - 32 mmol/L 19(L)  Calcium 8.9 - 10.3 mg/dL 9.3  Total Protein 6.5 - 8.1 g/dL 6.8  Total Bilirubin 0.3 - 1.2 mg/dL 0.2(L)  Alkaline Phos 38 - 126 U/L 82  AST 15 - 41 U/L 16  ALT 14 - 54 U/L 36   Edinburgh Postnatal Depression Scale Screening Tool 04/01/2020  I have been able to laugh and see the funny side of things. 0  I have looked forward with enjoyment to things. 0  I have  blamed myself unnecessarily when things went wrong. 0  I have been anxious or worried for no good reason. 1  I have felt scared or panicky for no good reason. 1  Things have been getting on top of me. 0  I have been so unhappy that I have had difficulty sleeping. 0  I have felt sad or miserable. 0  I have been so unhappy that I have been crying. 0  The thought of harming myself has occurred to me. 0  Edinburgh Postnatal Depression Scale Total 2   Vaccines: TDaP Declined         Flu    Declined  Discharge instruction:  per After Visit Summary,  [facility] OB booklet and  "Understanding Mother & Baby Care" hospital booklet  After Visit Meds:  Allergies as of 04/02/2020      Reactions   Bactrim [sulfamethoxazole-trimethoprim] Hives, Other (See Comments)   High fever   Sulfa  Antibiotics Hives, Other (See Comments)   High fever   Amoxicillin Rash   Has patient had a PCN reaction causing immediate rash, facial/tongue/throat swelling, SOB or lightheadedness with hypotension: Yes Has patient had a PCN reaction causing severe rash involving mucus membranes or skin necrosis: No Has patient had a PCN reaction that required hospitalization No Has patient had a PCN reaction occurring within the last 10 years: No If all of the above answers are "NO", then may proceed with Cephalosporin use.   Cephalexin Rash      Medication List    STOP taking these medications   methylergonovine 0.2 MG tablet Commonly known as: Methergine     TAKE these medications   acetaminophen 325 MG tablet Commonly known as: Tylenol Take 2 tablets (650 mg total) by mouth every 4 (four) hours as needed (for pain scale < 4).   Claritin 10 MG tablet Generic drug: loratadine Take 10 mg by mouth daily as needed (allergies).   coconut oil Oil Apply 1 application topically as needed.   ibuprofen 600 MG tablet Commonly known as: ADVIL Take 1 tablet (600 mg total) by mouth every 6 (six) hours.   prenatal multivitamin Tabs tablet Take 1 tablet by mouth at bedtime.            Discharge Care Instructions  (From admission, onward)         Start     Ordered   04/02/20 0000  Discharge wound care:       Comments: Sitz baths 2 times /day with warm water x 1 week. May add herbals: 1 ounce dried comfrey leaf* 1 ounce calendula flowers 1 ounce lavender flowers  Supplies can be found online at [employer] sources at [employer], [employer]  1/2 ounce dried uva ursi leaves 1/2 ounce witch hazel blossoms (if you can find them) 1/2 ounce dried sage leaf 1/2 cup sea salt Directions: Bring 2 quarts of water to a boil. Turn off heat, and place 1 ounce (approximately 1 large handful) of the above mixed herbs (not the salt) into the pot. Steep, covered, for 30 minutes.  Strain  the liquid well with a fine mesh strainer, and discard the herb material. Add 2 quarts of liquid to the tub, along with the 1/2 cup of salt. This medicinal liquid can also be made into compresses and peri-rinses.   04/02/20 1116         Diet: routine diet  Activity: Advance as tolerated. Pelvic rest for 6 weeks.   Postpartum contraception:  Not Discussed  Newborn Data: Live born female  Birth Weight: 7 lb 4.2 oz (3294 g) APGAR: 58, 9  Newborn Delivery   Birth date/time: 04/01/2020 03:33:00 Delivery type: Vaginal, Spontaneous     Named [facility] Baby Feeding: Breast Disposition:home with mother  Delivery Report:  Review the Delivery Report for details.    Follow up:  Follow-up Information    Aloha Gell, MD. Schedule an appointment as soon as possible for a visit in 6 week(s).   Specialty: Obstetrics and Gynecology Why: Please make an appointment for 6 weeks postpartum.  Contact information: [place] [address] [phone]              Signed: Zettie Pho, MSN 04/02/2020, 11:16 AM

## 2020-04-02 NOTE — Discharge Instructions (Signed)

## 2020-04-05 LAB — RPR: RPR Ser Ql: NONREACTIVE — AB

## 2020-04-06 ENCOUNTER — Inpatient Hospital Stay (HOSPITAL_COMMUNITY): Admission: AD | Admit: 2020-04-06 | Payer: BC Managed Care – PPO | Source: Home / Self Care

## 2021-02-21 ENCOUNTER — Ambulatory Visit: Payer: BC Managed Care – PPO | Admitting: Neurology

## 2021-03-14 ENCOUNTER — Encounter: Payer: Self-pay | Admitting: *Deleted

## 2021-03-15 ENCOUNTER — Ambulatory Visit: Payer: BC Managed Care – PPO | Admitting: Neurology

## 2021-03-15 ENCOUNTER — Telehealth: Payer: Self-pay | Admitting: Neurology

## 2021-03-15 ENCOUNTER — Encounter: Payer: Self-pay | Admitting: Neurology

## 2021-03-15 VITALS — BP 107/75 | HR 63 | Ht 62.0 in | Wt 154.6 lb

## 2021-03-15 DIAGNOSIS — G43009 Migraine without aura, not intractable, without status migrainosus: Secondary | ICD-10-CM

## 2021-03-15 DIAGNOSIS — R202 Paresthesia of skin: Secondary | ICD-10-CM

## 2021-03-15 DIAGNOSIS — R42 Dizziness and giddiness: Secondary | ICD-10-CM

## 2021-03-15 NOTE — Patient Instructions (Signed)
It was nice to meet you today.  Thankfully, your symptoms are intermittent and your neurological exam is benign.  You have intermittent migraines, thankfully not frequent enough to justify preventative medications such as day-to-day oral medication or once a month injections but we can certainly consider as needed medication for an acute migraine attack in the near future, especially once you have completely weaned off breast-feeding.  Unfortunately, dizziness is a very common complaint but is often not due to a primary neurological reason or single underlying medical problem. Often, there a combination of factors, that result in dizziness. This includes blood pressure fluctuations, medication side effects, blood sugar fluctuations, stress, vertigo, poor sleep with sleep deprivation, dehydration, and electrolyte disturbance or other metabolic and endocrinological reasons, meaning hormone related problems such as thyroid dysfunction. We will investigate things further with a brain MRI. We will call you with the test results.  We will also do blood work today and call you with the results.  Let's plan a follow-up in about 3 months.

## 2021-03-15 NOTE — Telephone Encounter (Addendum)
MRI brain w/wo contrast [employer] auth: [id number] (03/15/21-04/13/21)  Scheduled at [facility] 03/29/21 at 9:00 am

## 2021-03-15 NOTE — Progress Notes (Signed)
Subjective:    Patient ID: Jacqueline Barber is a 35 y.o. female.  HPI    Star Age, MD, PhD [facility] [address] [place], [place] [address]  Dear Dr. Pamala Hurry,  I saw your patient, Jacqueline Barber, upon your kind request in my neurologic clinic today for initial consultation of her dizziness.  Patient is unaccompanied today.  As you know, Jacqueline Barber is a 35 year old right-handed woman with an underlying medical history of gestational hypertension, anxiety, reflux disease, and overweight state, who reports a several month history of intermittent dizziness.  This can happen at random, while sitting or standing or walking.  She feels a nonspecific symptom of either lightheadedness or off-balance, feels like she could pass out at times or sometimes she just feels like she could fall.  She denies any actual spinning or swaying sensation, no associated nausea or vomiting, typically no headache with it.  1 episode of dizziness resulted in facial tingling which happened while she was sitting on the couch.  Symptoms resolved spontaneously after a few minutes, she did not have one-sided numbness or tingling but it felt like her whole face was tingling.  She has had some associated anxiety and was recently started on BuSpar for anxiety management.  Perhaps her dizzy spells are little bit less in frequency but anxiety is about the same by her estimate.  I reviewed your office note from 12/03/20.  She had blood work through your office in March 2022.  I was able to review the results from 11/01/2020, CMP was unremarkable, TSH normal, CBC with differential normal.  She has not actually passed out or fallen thankfully.  She had a recent eye exam within the past 6 to 7 months and she was told that her eyes were not fully aligned but no further explanation was given.  She does not have any significant eye related complaints.  She does have migraines which are thankfully  infrequent every few months.  She feels that her migraine frequency has actually improved over time.  She has a visual aura at times.  She has not had any sudden onset of one-sided weakness or numbness or tingling or droopy face or severe/thunderclap headache.  She has 3 children, she is currently weaning her youngest child of breastmilk.  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  She is a non-smoker and drinks alcohol occasionally, maybe once or twice a week and caffeine in the form of coffee, 1 or 2 cups in the mornings.  She hydrates well with water, drinks over 64 ounces of water per day.  She tries to get enough rest.  Her Past Medical History Is Significant For: Past Medical History:  Diagnosis Date   Anxiety    Depression    Gestational hypertension    previous pregnancy   Headache    Hemorrhoid    Migraines    No pertinent past medical history    Postpartum care following vaginal delivery (06/27/12) M 06/28/2012   Vaginal bleeding in pregnant patient at less than [redacted] weeks gestation 08/22/2014   Vaginal Pap smear, abnormal     Her Past Surgical History Is Significant For: Past Surgical History:  Procedure Laterality Date   DILATION AND EVACUATION N/A 05/02/2019   Procedure: DILATATION AND EVACUATION (D&E) 2ND TRIMESTER;  Surgeon: Aloha Gell, MD;  Location: [facility];  Service: Gynecology;  Laterality: N/A;   NO PAST SURGERIES     OPERATIVE ULTRASOUND N/A 05/02/2019  Procedure: OPERATIVE ULTRASOUND;  Surgeon: Aloha Gell, MD;  [facility];  Service: Gynecology;  Laterality: N/A;    Her Family History Is Significant For: Family History  Problem Relation Age of Onset   Hypertension Mother    Urolithiasis Mother    Hypertension Father    Hyperlipidemia Father    Migraines Father    Urolithiasis Father    Cancer Brother        Leukemia   Other Brother        found tumor on spine age 75..12/25/12   Hypertension Maternal Grandfather     Hypertension Maternal Grandmother    Diabetes Paternal Grandfather     Her Social History Is Significant For: Social History   Socioeconomic History   Marital status: Married    Spouse name: Not on file   Number of children: Not on file   Years of education: Not on file   Highest education level: Not on file  Occupational History   Not on file  Tobacco Use   Smoking status: Former    Types: Cigarettes    Quit date: 10/13/2011    Years since quitting: 9.4   Smokeless tobacco: Never  Vaping Use   Vaping Use: Never used  Substance and Sexual Activity   Alcohol use: Not Currently    Comment: Weekly. 1-2   Drug use: Never   Sexual activity: Yes    Birth control/protection: None  Other Topics Concern   Not on file  Social History Narrative   Lives at home, with husband and 3 kids.  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  Education : Oceanographer. Caffeine 2 cups coffee..   Social Determinants of Health   Financial Resource Strain: Not on file  Food Insecurity: Not on file  Transportation Needs: Not on file  Physical Activity: Not on file  Stress: Not on file  Social Connections: Not on file    Her Allergies Are:  Allergies  Allergen Reactions   Bactrim [Sulfamethoxazole-Trimethoprim] Hives and Other (See Comments)    High fever   Sulfa Antibiotics Hives and Other (See Comments)    High fever   Amoxicillin Rash    Has patient had a PCN reaction causing immediate rash, facial/tongue/throat swelling, SOB or lightheadedness with hypotension: Yes Has patient had a PCN reaction causing severe rash involving mucus membranes or skin necrosis: No Has patient had a PCN reaction that required hospitalization No Has patient had a PCN reaction occurring within the last 10 years: No If all of the above answers are "NO", then may proceed with Cephalosporin use.    Cephalexin Rash  :   Her Current Medications Are:  Outpatient Encounter Medications as of 03/15/2021   Medication Sig   busPIRone (BUSPAR) 7.5 MG tablet Take 7.5 mg by mouth 2 (two) times daily.   loratadine (CLARITIN) 10 MG tablet Take 10 mg by mouth daily as needed (allergies).    [DISCONTINUED] acetaminophen (TYLENOL) 325 MG tablet Take 2 tablets (650 mg total) by mouth every 4 (four) hours as needed (for pain scale < 4).   [DISCONTINUED] coconut oil OIL Apply 1 application topically as needed.   [DISCONTINUED] ibuprofen (ADVIL) 600 MG tablet Take 1 tablet (600 mg total) by mouth every 6 (six) hours.   [DISCONTINUED] Iron-Folic 99991111 (FERRAPLUS 90 PO) Take 1 tablet by mouth daily.   [DISCONTINUED] Prenatal Vit-Fe Fumarate-FA (PRENATAL MULTIVITAMIN) TABS Take 1 tablet by mouth at bedtime.    No facility-administered encounter medications  on file as of 03/15/2021.  :   Review of Systems:  Out of a complete 14 point review of systems, all are reviewed and negative with the exception of these symptoms as listed below:  Review of Systems  Neurological:        Dizziness for the last several months.  (Noted tingling in face at one time).     Objective:  Neurological Exam  Physical Exam Physical Examination:   Vitals:   03/15/21 0708 03/15/21 0717  BP: 107/75   Pulse: 63 63    General Examination: The patient is a very pleasant 35 y.o. female in no acute distress. She appears well-developed and well-nourished and well groomed.  She denies any vertiginous symptoms or orthostatic lightheadedness.  She did not have any orthostatic hypotension on orthostatic vital testing.  HEENT: Normocephalic, atraumatic, pupils are equal, round and reactive to light and accommodation. Funduscopic exam is normal with sharp disc margins noted. Extraocular tracking is good without limitation to gaze excursion or nystagmus noted. Normal smooth pursuit is noted. Hearing is grossly intact. Tympanic membranes are clear bilaterally. Face is symmetric with normal facial animation and normal facial  sensation. Speech is clear with no dysarthria noted. There is no hypophonia. There is no lip, neck/head, jaw or voice tremor. Neck is supple with full range of passive and active motion. There are no carotid bruits on auscultation. Oropharynx exam reveals: mild mouth dryness, good dental hygiene and a small airway is noted.  Tongue protrudes centrally and palate elevates symmetrically.   Chest: Clear to auscultation without wheezing, rhonchi or crackles noted.  Heart: S1+S2+0, regular and normal without murmurs, rubs or gallops noted.   Abdomen: Soft, non-tender and non-distended with normal bowel sounds appreciated on auscultation.  Extremities: There is no pitting edema in the distal lower extremities bilaterally. Pedal pulses are intact.  Skin: Warm and dry without trophic changes noted. There are no varicose veins.  Musculoskeletal: exam reveals no obvious joint deformities, tenderness or joint swelling or erythema.   Neurologically:  Mental status: The patient is awake, alert and oriented in all 4 spheres. Her immediate and remote memory, attention, language skills and fund of knowledge are appropriate. There is no evidence of aphasia, agnosia, apraxia or anomia. Speech is clear with normal prosody and enunciation. Thought process is linear. Mood is normal and affect is normal.  Cranial nerves II - XII are as described above under HEENT exam. In addition: shoulder shrug is normal with equal shoulder height noted. Motor exam: Normal bulk, strength and tone is noted. There is no drift, tremor or rebound. Romberg is negative. Reflexes are 1+ throughout. Babinski: Toes are flexor bilaterally. Fine motor skills and coordination: intact with normal finger taps, normal hand movements, normal rapid alternating patting, normal foot taps and normal foot agility.  Cerebellar testing: No dysmetria or intention tremor on finger to nose testing. Heel to shin is unremarkable bilaterally. There is no truncal  or gait ataxia.  Sensory exam: intact to light touch, vibration, temperature sense in the upper and lower extremities.  Gait, station and balance: She stands easily. No veering to one side is noted. No leaning to one side is noted. Posture is age-appropriate and stance is narrow based. Gait shows normal stride length and normal pace. No problems turning are noted. Tandem walk is unremarkable.  Assessment and Plan:   In summary, Jacqueline Barber is a very pleasant 35 y.o.-year old female with an underlying medical history of gestational hypertension, anxiety, reflux  disease, and overweight state, who presents for evaluation of her dizziness of several months duration.  Symptoms come and go and does not last all day.  She has not had any significant neurological accompaniments with the exception of facial tingling 1 time which spontaneously subsided.  She does have a history of infrequent migraines and migraines have not typically been associated with her dizziness spells.  Her neurological exam is normal and she is largely reassured in that regard.  She is advised to stay well-hydrated and well rested.  She has had some blood work a few months ago, I would like to proceed with repeat and additional blood work to look for vitamin D deficiency, anemia, iron deficiency, B12 deficiency and thyroid dysfunction.  Symptoms are not telltale for vertigo or vertiginous migraines.  We also talked about her migraines, she has a migraine every few months at this time.  We can certainly consider prescription medications such as Imitrex or sumatriptan and or one of the newer medication such as Ubrelvy or Nurtec in the near future, she is currently weaning off breast-feeding.  I would like to proceed with a brain MRI with and without contrast to rule out a structural cause of her symptoms.  We will keep her posted as to her test results by phone call and reconvene in about 3 months, sooner if needed.  I answered all her  questions today and the patient was in agreement with the plan.   Thank you very much for allowing me to participate in the care of this nice patient. If I can be of any further assistance to you please do not hesitate to call me at [phone].  Sincerely,   Star Age, MD, PhD

## 2021-03-16 ENCOUNTER — Telehealth: Payer: Self-pay | Admitting: *Deleted

## 2021-03-16 LAB — CBC WITH DIFFERENTIAL/PLATELET
Basophils Absolute: 0 10*3/uL (ref 0.0–0.2)
Basos: 0 %
EOS (ABSOLUTE): 0.2 10*3/uL (ref 0.0–0.4)
Eos: 3 %
Hematocrit: 40.8 % (ref 34.0–46.6)
Hemoglobin: 13.4 g/dL (ref 11.1–15.9)
Immature Grans (Abs): 0 10*3/uL (ref 0.0–0.1)
Immature Granulocytes: 0 %
Lymphocytes Absolute: 2 10*3/uL (ref 0.7–3.1)
Lymphs: 36 %
MCH: 30.4 pg (ref 26.6–33.0)
MCHC: 32.8 g/dL (ref 31.5–35.7)
MCV: 93 fL (ref 79–97)
Monocytes Absolute: 0.3 10*3/uL (ref 0.1–0.9)
Monocytes: 6 %
Neutrophils Absolute: 3 10*3/uL (ref 1.4–7.0)
Neutrophils: 55 %
Platelets: 240 10*3/uL (ref 150–450)
RBC: 4.41 x10E6/uL (ref 3.77–5.28)
RDW: 11.8 % (ref 11.7–15.4)
WBC: 5.6 10*3/uL (ref 3.4–10.8)

## 2021-03-16 LAB — COMPREHENSIVE METABOLIC PANEL
ALT: 6 IU/L (ref 0–32)
AST: 15 IU/L (ref 0–40)
Albumin/Globulin Ratio: 2.2 (ref 1.2–2.2)
Albumin: 4.6 g/dL (ref 3.8–4.8)
Alkaline Phosphatase: 88 IU/L (ref 44–121)
BUN/Creatinine Ratio: 16 (ref 9–23)
BUN: 11 mg/dL (ref 6–20)
Bilirubin Total: 0.4 mg/dL (ref 0.0–1.2)
CO2: 24 mmol/L (ref 20–29)
Calcium: 9.6 mg/dL (ref 8.7–10.2)
Chloride: 104 mmol/L (ref 96–106)
Creatinine, Ser: 0.67 mg/dL (ref 0.57–1.00)
Globulin, Total: 2.1 g/dL (ref 1.5–4.5)
Glucose: 83 mg/dL (ref 65–99)
Potassium: 4.4 mmol/L (ref 3.5–5.2)
Sodium: 142 mmol/L (ref 134–144)
Total Protein: 6.7 g/dL (ref 6.0–8.5)
eGFR: 117 mL/min/{1.73_m2} (ref >59)

## 2021-03-16 LAB — HGB A1C W/O EAG: Hgb A1c MFr Bld: 5 % (ref 4.8–5.6)

## 2021-03-16 LAB — VITAMIN D 25 HYDROXY (VIT D DEFICIENCY, FRACTURES): Vit D, 25-Hydroxy: 42.1 ng/mL (ref 30.0–100.0)

## 2021-03-16 LAB — TSH: TSH: 1.93 u[IU]/mL (ref 0.450–4.500)

## 2021-03-16 LAB — IRON AND TIBC
Iron Saturation: 35 % (ref 15–55)
Iron: 112 ug/dL (ref 27–159)
Total Iron Binding Capacity: 322 ug/dL (ref 250–450)
UIBC: 210 ug/dL (ref 131–425)

## 2021-03-16 LAB — SEDIMENTATION RATE: Sed Rate: 4 mm/hr (ref 0–32)

## 2021-03-16 LAB — B12 AND FOLATE PANEL
Folate: >20 ng/mL (ref >3.0)
Vitamin B-12: 467 pg/mL (ref 232–1245)

## 2021-03-16 LAB — FERRITIN: Ferritin: 68 ng/mL (ref 15–150)

## 2021-03-16 NOTE — Telephone Encounter (Signed)
I spoke to the patient and provided her with the labs. She is agreeable to proceed w/ the planned MRI.

## 2021-03-16 NOTE — Telephone Encounter (Signed)
-----   Message from Star Age, MD sent at 03/16/2021  8:17 AM EDT ----- Labs were benign.  Please update patient.  Plan is to proceed with brain MRI as scheduled.

## 2021-03-28 NOTE — Telephone Encounter (Signed)
Patient husband on dpr called and canceled the appt MRI for tomorrow 03/29/21.

## 2021-03-29 ENCOUNTER — Other Ambulatory Visit: Payer: BC Managed Care – PPO

## 2021-06-15 ENCOUNTER — Ambulatory Visit: Payer: BC Managed Care – PPO | Admitting: Neurology

## 2022-01-23 NOTE — Telephone Encounter (Signed)
No additional notes. Close encounter.

## 2022-07-31 ENCOUNTER — Ambulatory Visit
Admission: EM | Admit: 2022-07-31 | Discharge: 2022-07-31 | Disposition: A | Discharge status: Home / Self Care | Payer: BC Managed Care – PPO | Attending: Urgent Care | Admitting: Urgent Care

## 2022-07-31 DIAGNOSIS — R300 Dysuria: Secondary | ICD-10-CM | POA: Diagnosis present

## 2022-07-31 DIAGNOSIS — R350 Frequency of micturition: Secondary | ICD-10-CM | POA: Insufficient documentation

## 2022-07-31 LAB — POCT URINALYSIS DIP (MANUAL ENTRY)
Bilirubin, UA: NEGATIVE
Blood, UA: NEGATIVE
Glucose, UA: NEGATIVE mg/dL
Ketones, POC UA: NEGATIVE mg/dL
Leukocytes, UA: NEGATIVE
Nitrite, UA: NEGATIVE
Protein Ur, POC: NEGATIVE mg/dL
Spec Grav, UA: 1.015 (ref 1.010–1.025)
Urobilinogen, UA: 0.2 E.U./dL
pH, UA: 6.5 (ref 5.0–8.0)

## 2022-07-31 LAB — POCT URINE PREGNANCY: Preg Test, Ur: NEGATIVE

## 2022-07-31 NOTE — ED Provider Notes (Signed)
[facility]  Note:  This document was prepared using Systems analyst and may include unintentional dictation errors.  MRN: [id number] DOB: 1985-11-29  Subjective:   Jacqueline Barber is a 35 y.o. female presenting for 2-day history of acute onset persistent dysuria, urinary frequency, pelvic pressure and low back pain.  Symptoms are improved today.  Unfortunately she has uterine fibroids that at times affecting her ability to urinate.  This was the case over the weekend and she developed her urinary symptoms the next day.  She has hydrated very well and wanted to make sure we had a urinalysis done to evaluate for recurrent urinary tract infection.  Otherwise patient hydrates well and avoids urinary irritants.  Currently she is asymptomatic today.  No current facility-administered medications for this encounter.  Current Outpatient Medications:    buPROPion (WELLBUTRIN XL) 300 MG 24 hr tablet, Take 300 mg by mouth daily., Disp: , Rfl:    FLUoxetine (PROZAC) 40 MG capsule, Take 40 mg by mouth daily., Disp: , Rfl:    hydrOXYzine (VISTARIL) 25 MG capsule, Take 25 mg by mouth at bedtime., Disp: , Rfl:    busPIRone (BUSPAR) 7.5 MG tablet, Take 7.5 mg by mouth 2 (two) times daily., Disp: , Rfl:    loratadine (CLARITIN) 10 MG tablet, Take 10 mg by mouth daily as needed (allergies). , Disp: , Rfl:    Allergies  Allergen Reactions   Bactrim [Sulfamethoxazole-Trimethoprim] Hives and Other (See Comments)    High fever   Sulfa Antibiotics Hives and Other (See Comments)    High fever   Amoxicillin Rash    Has patient had a PCN reaction causing immediate rash, facial/tongue/throat swelling, SOB or lightheadedness with hypotension: Yes Has patient had a PCN reaction causing severe rash involving mucus membranes or skin necrosis: No Has patient had a PCN reaction that required hospitalization No Has patient had a PCN reaction occurring within the last 10 years:  No If all of the above answers are "NO", then may proceed with Cephalosporin use.    Cephalexin Rash    Past Medical History:  Diagnosis Date   Anxiety    Depression    Gestational hypertension    previous pregnancy   Headache    Hemorrhoid    Migraines    No pertinent past medical history    Postpartum care following vaginal delivery (06/27/12) M 06/28/2012   Vaginal bleeding in pregnant patient at less than [redacted] weeks gestation 08/22/2014   Vaginal Pap smear, abnormal      Past Surgical History:  Procedure Laterality Date   DILATION AND EVACUATION N/A 05/02/2019   Procedure: DILATATION AND EVACUATION (D&E) 2ND TRIMESTER;  Surgeon: Aloha Gell, MD;  Location: [facility];  Service: Gynecology;  Laterality: N/A;   NO PAST SURGERIES     OPERATIVE ULTRASOUND N/A 05/02/2019   Procedure: OPERATIVE ULTRASOUND;  Surgeon: Aloha Gell, MD;  Location: [facility];  Service: Gynecology;  Laterality: N/A;    Family History  Problem Relation Age of Onset   Hypertension Mother    Urolithiasis Mother    Hypertension Father    Hyperlipidemia Father    Migraines Father    Urolithiasis Father    Cancer Brother        Leukemia   Other Brother        found tumor on spine age 52..12/25/12   Hypertension Maternal Grandfather    Hypertension Maternal Grandmother    Diabetes Paternal  Grandfather     Social History   Tobacco Use   Smoking status: Former    Types: Cigarettes    Quit date: 10/13/2011    Years since quitting: 10.8   Smokeless tobacco: Never  Vaping Use   Vaping Use: Never used  Substance Use Topics   Alcohol use: Yes    Comment: weekly   Drug use: Never    ROS   Objective:   Vitals: BP (!) 156/85 (BP Location: Left Arm)   Pulse 83   Temp 98.9 F (37.2 C) (Oral)   Resp 16   SpO2 99%   Physical Exam Constitutional:      General: She is not in acute distress.    Appearance: Normal appearance. She is well-developed. She  is not ill-appearing, toxic-appearing or diaphoretic.  HENT:     Head: Normocephalic and atraumatic.     Nose: Nose normal.     Mouth/Throat:     Mouth: Mucous membranes are moist.  Eyes:     General: No scleral icterus.       Right eye: No discharge.        Left eye: No discharge.     Extraocular Movements: Extraocular movements intact.     Conjunctiva/sclera: Conjunctivae normal.  Cardiovascular:     Rate and Rhythm: Normal rate.  Pulmonary:     Effort: Pulmonary effort is normal.  Abdominal:     General: Bowel sounds are normal. There is no distension.     Palpations: Abdomen is soft. There is no mass.     Tenderness: There is no abdominal tenderness. There is no right CVA tenderness, left CVA tenderness, guarding or rebound.  Skin:    General: Skin is warm and dry.  Neurological:     General: No focal deficit present.     Mental Status: She is alert and oriented to person, place, and time.  Psychiatric:        Mood and Affect: Mood normal.        Behavior: Behavior normal.        Thought Content: Thought content normal.        Judgment: Judgment normal.     Results for orders placed or performed during the hospital encounter of 07/31/22 (from the past 24 hour(s))  POCT urine pregnancy     Status: None   Collection Time: 07/31/22  6:16 PM  Result Value Ref Range   Preg Test, Ur Negative Negative  POCT urinalysis dipstick     Status: None   Collection Time: 07/31/22  6:16 PM  Result Value Ref Range   Color, UA yellow yellow   Clarity, UA clear clear   Glucose, UA negative negative mg/dL   Bilirubin, UA negative negative   Ketones, POC UA negative negative mg/dL   Spec Grav, UA 1.015 1.010 - 1.025   Blood, UA negative negative   pH, UA 6.5 5.0 - 8.0   Protein Ur, POC negative negative mg/dL   Urobilinogen, UA 0.2 0.2 or 1.0 E.U./dL   Nitrite, UA Negative Negative   Leukocytes, UA Negative Negative    Assessment and Plan :   PDMP not reviewed this  encounter.  1. Dysuria   2. Urinary frequency     Will defer empiric treatment, urine culture pending.  Recommended continued hydration. Counseled patient on potential for adverse effects with medications prescribed/recommended today, ER and return-to-clinic precautions discussed, patient verbalized understanding.    Jaynee Eagles, PA-C 08/01/22 0800

## 2022-07-31 NOTE — Discharge Instructions (Addendum)

## 2022-07-31 NOTE — ED Triage Notes (Signed)
Pt c/o decrease UO 2 days ago-when she started voided she developed dysuria and freq, nausea-NAD-steady gait

## 2022-08-02 LAB — URINE CULTURE

## 2022-09-01 ENCOUNTER — Other Ambulatory Visit: Payer: Self-pay | Admitting: Obstetrics

## 2022-09-19 ENCOUNTER — Encounter (HOSPITAL_BASED_OUTPATIENT_CLINIC_OR_DEPARTMENT_OTHER): Payer: Self-pay | Admitting: Obstetrics

## 2022-09-20 ENCOUNTER — Encounter (HOSPITAL_BASED_OUTPATIENT_CLINIC_OR_DEPARTMENT_OTHER): Payer: Self-pay | Admitting: Obstetrics

## 2022-09-20 ENCOUNTER — Other Ambulatory Visit: Payer: Self-pay

## 2022-09-20 NOTE — Progress Notes (Signed)
Your procedure is scheduled on Thursday, 10/05/2022.  Report to [facility] AT  11:30 AM.   Call this number if you have problems the morning of surgery  :[phone].   OUR ADDRESS IS [address].  WE ARE LOCATED IN [facility].  PLEASE BRING YOUR INSURANCE CARD AND PHOTO ID DAY OF SURGERY.  ONLY 2 PEOPLE ARE ALLOWED IN  WAITING  ROOM / CURRENTLY NO ONE UNDER AGE 37                                     REMEMBER:  DO NOT EAT FOOD, CANDY GUM OR MINTS  AFTER MIDNIGHT THE NIGHT BEFORE YOUR SURGERY . YOU MAY HAVE CLEAR LIQUIDS FROM MIDNIGHT THE NIGHT BEFORE YOUR SURGERY UNTIL  10:30 AM. NO CLEAR LIQUIDS AFTER   10:30 AM DAY OF SURGERY.  YOU MAY  BRUSH YOUR TEETH MORNING OF SURGERY AND RINSE YOUR MOUTH OUT, NO CHEWING GUM CANDY OR MINTS.     CLEAR LIQUID DIET    Allowed      Water                                                                   Coffee and tea, regular and decaf  (NO cream or milk products of any type, may sweeten)                         Carbonated beverages, regular and diet                                    Sports drinks like Gatorade _____________________________________________________________________     TAKE ONLY THESE MEDICATIONS MORNING OF SURGERY: Wellbutrin, Prozac    UP TO 4 VISITORS  MAY VISIT IN THE EXTENDED RECOVERY ROOM UNTIL 800 PM ONLY.  ONE  VISITOR AGE 6 AND OVER MAY SPEND THE NIGHT AND MUST BE IN EXTENDED RECOVERY ROOM NO LATER THAN 800 PM . YOUR DISCHARGE TIME AFTER YOU SPEND THE NIGHT IS 900 AM THE MORNING AFTER YOUR SURGERY.  YOU MAY PACK A SMALL OVERNIGHT BAG WITH TOILETRIES FOR YOUR OVERNIGHT STAY IF YOU WISH.  YOUR PRESCRIPTION MEDICATIONS WILL BE PROVIDED DURING [place].                                      DO NOT WEAR JEWERLY/  METAL/  PIERCINGS (INCLUDING NO PLASTIC PIERCINGS) DO NOT WEAR LOTIONS, POWDERS, PERFUMES OR NAIL POLISH ON YOUR FINGERNAILS. TOENAIL POLISH IS OK TO  WEAR. DO NOT SHAVE FOR 48 HOURS PRIOR TO DAY OF SURGERY.  CONTACTS, GLASSES, OR DENTURES MAY NOT BE WORN TO SURGERY.  REMEMBER: NO SMOKING, VAPING ,  DRUGS OR ALCOHOL FOR 24 HOURS BEFORE YOUR SURGERY.                                    [facility] IS NOT RESPONSIBLE  FOR ANY BELONGINGS.                                                                    Marland Kitchen           [facility] - Preparing for Surgery Before surgery, you can play an important role.  Because skin is not sterile, your skin needs to be as free of germs as possible.  You can reduce the number of germs on your skin by washing with CHG (chlorahexidine gluconate) soap before surgery.  CHG is an antiseptic cleaner which kills germs and bonds with the skin to continue killing germs even after washing. Please DO NOT use if you have an allergy to CHG or antibacterial soaps.  If your skin becomes reddened/irritated stop using the CHG and inform your nurse when you arrive at Short Stay. Do not shave (including legs and underarms) for at least 48 hours prior to the first CHG shower.  You may shave your face/neck. Please follow these instructions carefully:  1.  Shower with CHG Soap the night before surgery and the  morning of Surgery.  2.  If you choose to wash your hair, wash your hair first as usual with your  normal  shampoo.  3.  After you shampoo, rinse your hair and body thoroughly to remove the  shampoo.                                        4.  Use CHG as you would any other liquid soap.  You can apply chg directly  to the skin and wash , chg soap provided, night before and morning of your surgery.  5.  Apply the CHG Soap to your body ONLY FROM THE NECK DOWN.   Do not use on face/ open                           Wound or open sores. Avoid contact with eyes, ears mouth and genitals (private parts).                       Wash face,  Genitals (private parts) with your normal soap.             6.  Wash thoroughly, paying special attention  to the area where your surgery  will be performed.  7.  Thoroughly rinse your body with warm water from the neck down.  8.  DO NOT shower/wash with your normal soap after using and rinsing off  the CHG Soap.             9.  Pat yourself dry with a clean towel.            10.  Wear clean pajamas.            11.  Place clean sheets on your bed the night of your first shower and do not  sleep with pets. Day of Surgery : Do not apply any lotions/ powders the morning of surgery.  Please wear clean clothes to the hospital/surgery center.  IF YOU HAVE ANY SKIN IRRITATION OR PROBLEMS WITH THE SURGICAL SOAP, PLEASE GET A BAR OF GOLD DIAL SOAP AND SHOWER THE NIGHT BEFORE YOUR SURGERY AND THE MORNING OF YOUR SURGERY. PLEASE LET THE NURSE KNOW MORNING OF YOUR SURGERY IF YOU HAD ANY PROBLEMS WITH THE SURGICAL SOAP.   ________________________________________________________________________                                                        QUESTIONS [facility] PRE OP NURSE PHONE [phone].

## 2022-09-20 NOTE — Progress Notes (Signed)
Spoke w/ via phone for pre-op interview---Jacqueline Barber needs dos----urine pregnancy               Barber results------10/03/22 Barber appt for cbc, bmp, type & screen COVID test -----patient states asymptomatic no test needed Arrive at -------1130 on Thursday, 10/05/2022 NPO after MN NO Solid Food.  Clear liquids from MN until---1030 Med rec completed Medications to take morning of surgery -----Wellbutrin, Prozac Diabetic medication -----n/a Patient instructed no nail polish to be worn day of surgery Patient instructed to bring photo id and insurance card day of surgery Patient aware to have Driver (ride ) / caregiver    for 24 hours after surgery - husband, Jacqueline Barber Patient Special Instructions -----Extended / overnight stay instructions given. Pre-Op special Istructions -----none Patient verbalized understanding of instructions that were given at this phone interview. Patient denies shortness of breath, chest pain, fever, cough at this phone interview.

## 2022-09-27 ENCOUNTER — Other Ambulatory Visit: Payer: Self-pay | Admitting: Obstetrics

## 2022-10-03 ENCOUNTER — Encounter (HOSPITAL_COMMUNITY)
Admission: RE | Admit: 2022-10-03 | Discharge: 2022-10-03 | Disposition: A | Discharge status: Home / Self Care | Payer: BC Managed Care – PPO | Source: Ambulatory Visit | Attending: Obstetrics | Admitting: Obstetrics

## 2022-10-03 DIAGNOSIS — Z01812 Encounter for preprocedural laboratory examination: Secondary | ICD-10-CM | POA: Insufficient documentation

## 2022-10-03 LAB — BASIC METABOLIC PANEL
Anion gap: 8 (ref 5–15)
BUN: 13 mg/dL (ref 6–20)
CO2: 23 mmol/L (ref 22–32)
Calcium: 8.8 mg/dL — ABNORMAL LOW (ref 8.9–10.3)
Chloride: 102 mmol/L (ref 98–111)
Creatinine, Ser: 0.53 mg/dL (ref 0.44–1.00)
GFR, Estimated: >60 mL/min (ref >60)
Glucose, Bld: 80 mg/dL (ref 70–99)
Potassium: 3.9 mmol/L (ref 3.5–5.1)
Sodium: 133 mmol/L — ABNORMAL LOW (ref 135–145)

## 2022-10-03 LAB — CBC
HCT: 30.9 % — ABNORMAL LOW (ref 36.0–46.0)
Hemoglobin: 8.8 g/dL — ABNORMAL LOW (ref 12.0–15.0)
MCH: 19.8 pg — ABNORMAL LOW (ref 26.0–34.0)
MCHC: 28.5 g/dL — ABNORMAL LOW (ref 30.0–36.0)
MCV: 69.4 fL — ABNORMAL LOW (ref 80.0–100.0)
Platelets: 316 10*3/uL (ref 150–400)
RBC: 4.45 MIL/uL (ref 3.87–5.11)
RDW: 21.3 % — ABNORMAL HIGH (ref 11.5–15.5)
WBC: 6.8 10*3/uL (ref 4.0–10.5)
nRBC: 0 % (ref 0.0–0.2)

## 2022-10-03 NOTE — Progress Notes (Signed)
I routed lab work (cbc and bmp) to Dr. Pamala Hurry on 10/03/22 due to patient's Hgb of 8.8.

## 2022-10-04 NOTE — Progress Notes (Signed)
I spoke with anesthesia concerning patient's Hgb of 8.8. Per Dr. Tobias Alexander, MDA, it is okay to proceed with surgery. I discussed possibility of giving a blood transfusion prior to surgery with Rolla Flatten, RN. Per Pam, we do not do blood transfusions in pre-op. If a patient needs a blood transfusion prior to surgery, the patient needs to have it done at the infusion center or be done at the main hospital. It is not optimal to do blood transfusions in the OR or post-op, but these can be done if needed. IV iron can be given post-operatively as long as there is a nurse present that is checked off on infusing IV iron. I conveyed this information to Dr. Pamala Hurry via Epic IB on 10/04/22.

## 2022-10-05 ENCOUNTER — Other Ambulatory Visit: Payer: Self-pay

## 2022-10-05 ENCOUNTER — Ambulatory Visit (HOSPITAL_BASED_OUTPATIENT_CLINIC_OR_DEPARTMENT_OTHER)
Admission: RE | Admit: 2022-10-05 | Discharge: 2022-10-06 | Disposition: A | Discharge status: Home / Self Care | Payer: BC Managed Care – PPO | Attending: Obstetrics | Admitting: Obstetrics

## 2022-10-05 ENCOUNTER — Ambulatory Visit (HOSPITAL_BASED_OUTPATIENT_CLINIC_OR_DEPARTMENT_OTHER): Payer: BC Managed Care – PPO | Admitting: Anesthesiology

## 2022-10-05 ENCOUNTER — Encounter (HOSPITAL_BASED_OUTPATIENT_CLINIC_OR_DEPARTMENT_OTHER)
Admission: RE | Disposition: A | Discharge status: Home / Self Care | Payer: Self-pay | Source: Home / Self Care | Attending: Obstetrics

## 2022-10-05 ENCOUNTER — Encounter (HOSPITAL_BASED_OUTPATIENT_CLINIC_OR_DEPARTMENT_OTHER): Payer: Self-pay | Admitting: Obstetrics

## 2022-10-05 DIAGNOSIS — N920 Excessive and frequent menstruation with regular cycle: Secondary | ICD-10-CM | POA: Insufficient documentation

## 2022-10-05 DIAGNOSIS — Z90710 Acquired absence of both cervix and uterus: Secondary | ICD-10-CM | POA: Diagnosis present

## 2022-10-05 DIAGNOSIS — D259 Leiomyoma of uterus, unspecified: Secondary | ICD-10-CM | POA: Diagnosis present

## 2022-10-05 DIAGNOSIS — Z87891 Personal history of nicotine dependence: Secondary | ICD-10-CM | POA: Insufficient documentation

## 2022-10-05 DIAGNOSIS — D649 Anemia, unspecified: Secondary | ICD-10-CM | POA: Insufficient documentation

## 2022-10-05 DIAGNOSIS — Z01818 Encounter for other preprocedural examination: Secondary | ICD-10-CM

## 2022-10-05 HISTORY — DX: Excessive and frequent menstruation with regular cycle: N92.0

## 2022-10-05 HISTORY — PX: ROBOTIC ASSISTED LAPAROSCOPIC HYSTERECTOMY AND SALPINGECTOMY: SHX6379

## 2022-10-05 HISTORY — PX: IUD REMOVAL: SHX5392

## 2022-10-05 LAB — POCT PREGNANCY, URINE: Preg Test, Ur: NEGATIVE

## 2022-10-05 LAB — TYPE AND SCREEN
ABO/RH(D): A POS
Antibody Screen: NEGATIVE

## 2022-10-05 SURGERY — XI ROBOTIC ASSISTED LAPAROSCOPIC HYSTERECTOMY AND SALPINGECTOMY
Anesthesia: General | Site: Uterus

## 2022-10-05 MED ORDER — PHENYLEPHRINE 80 MCG/ML (10ML) SYRINGE FOR IV PUSH (FOR BLOOD PRESSURE SUPPORT)
PREFILLED_SYRINGE | INTRAVENOUS | Status: AC
  Filled 2022-10-05: qty 10

## 2022-10-05 MED ORDER — KETOROLAC TROMETHAMINE 30 MG/ML IJ SOLN
INTRAMUSCULAR | Status: AC
  Filled 2022-10-05: qty 1

## 2022-10-05 MED ORDER — ACETAMINOPHEN 500 MG PO TABS
ORAL_TABLET | ORAL | Status: AC
  Filled 2022-10-05: qty 2

## 2022-10-05 MED ORDER — ACETAMINOPHEN 500 MG PO TABS
1000.0000 mg | ORAL_TABLET | Freq: Four times a day (QID) | ORAL | Status: DC
  Administered 2022-10-05 – 2022-10-06 (×3): 1000 mg via ORAL

## 2022-10-05 MED ORDER — POVIDONE-IODINE 10 % EX SWAB: 2.0000 | Freq: Once | CUTANEOUS | Status: DC

## 2022-10-05 MED ORDER — ROCURONIUM BROMIDE 10 MG/ML (PF) SYRINGE
PREFILLED_SYRINGE | INTRAVENOUS | Status: DC | PRN
  Administered 2022-10-05: 20 mg via INTRAVENOUS
  Administered 2022-10-05: 70 mg via INTRAVENOUS

## 2022-10-05 MED ORDER — OXYCODONE HCL 5 MG PO TABS
ORAL_TABLET | ORAL | Status: AC
  Filled 2022-10-05: qty 2

## 2022-10-05 MED ORDER — PROPOFOL 10 MG/ML IV BOLUS
INTRAVENOUS | Status: AC
  Filled 2022-10-05: qty 20

## 2022-10-05 MED ORDER — FENTANYL CITRATE (PF) 100 MCG/2ML IJ SOLN
INTRAMUSCULAR | Status: AC
  Filled 2022-10-05: qty 2

## 2022-10-05 MED ORDER — PANTOPRAZOLE SODIUM 40 MG PO TBEC
40.0000 mg | DELAYED_RELEASE_TABLET | Freq: Every day | ORAL | Status: DC
  Administered 2022-10-05: 40 mg via ORAL

## 2022-10-05 MED ORDER — SODIUM CHLORIDE 0.9 % IV SOLN
INTRAVENOUS | Status: DC | PRN
  Administered 2022-10-05: 60 mL

## 2022-10-05 MED ORDER — IBUPROFEN 200 MG PO TABS
600.0000 mg | ORAL_TABLET | Freq: Four times a day (QID) | ORAL | Status: DC
  Administered 2022-10-06: 600 mg via ORAL

## 2022-10-05 MED ORDER — DEXAMETHASONE SODIUM PHOSPHATE 10 MG/ML IJ SOLN
INTRAMUSCULAR | Status: DC | PRN
  Administered 2022-10-05: 10 mg via INTRAVENOUS

## 2022-10-05 MED ORDER — HYDROMORPHONE HCL 1 MG/ML IJ SOLN
INTRAMUSCULAR | Status: AC
  Filled 2022-10-05: qty 1

## 2022-10-05 MED ORDER — BUPIVACAINE HCL (PF) 0.25 % IJ SOLN
INTRAMUSCULAR | Status: DC | PRN
  Administered 2022-10-05: 17 mL

## 2022-10-05 MED ORDER — GENTAMICIN SULFATE 40 MG/ML IJ SOLN
360.0000 mg | INTRAVENOUS | Status: AC
  Administered 2022-10-05: 360 mg via INTRAVENOUS
  Filled 2022-10-05: qty 9

## 2022-10-05 MED ORDER — LACTATED RINGERS IV SOLN: INTRAVENOUS | Status: DC

## 2022-10-05 MED ORDER — LIDOCAINE 2% (20 MG/ML) 5 ML SYRINGE
INTRAMUSCULAR | Status: DC | PRN
  Administered 2022-10-05: 100 mg via INTRAVENOUS

## 2022-10-05 MED ORDER — PHENYLEPHRINE 80 MCG/ML (10ML) SYRINGE FOR IV PUSH (FOR BLOOD PRESSURE SUPPORT)
PREFILLED_SYRINGE | INTRAVENOUS | Status: DC | PRN
  Administered 2022-10-05: 160 ug via INTRAVENOUS
  Administered 2022-10-05 (×2): 80 ug via INTRAVENOUS
  Administered 2022-10-05 (×2): 160 ug via INTRAVENOUS
  Administered 2022-10-05 (×3): 80 ug via INTRAVENOUS

## 2022-10-05 MED ORDER — CLINDAMYCIN PHOSPHATE 900 MG/50ML IV SOLN
900.0000 mg | INTRAVENOUS | Status: AC
  Administered 2022-10-05: 900 mg via INTRAVENOUS

## 2022-10-05 MED ORDER — KETOROLAC TROMETHAMINE 30 MG/ML IJ SOLN
30.0000 mg | Freq: Four times a day (QID) | INTRAMUSCULAR | Status: DC
  Administered 2022-10-05 – 2022-10-06 (×2): 30 mg via INTRAVENOUS

## 2022-10-05 MED ORDER — LIDOCAINE HCL (PF) 2 % IJ SOLN
INTRAMUSCULAR | Status: DC | PRN
  Administered 2022-10-05: 1.5 mg/kg/h via INTRADERMAL

## 2022-10-05 MED ORDER — DEXAMETHASONE SODIUM PHOSPHATE 10 MG/ML IJ SOLN
INTRAMUSCULAR | Status: AC
  Filled 2022-10-05: qty 1

## 2022-10-05 MED ORDER — KETAMINE HCL 50 MG/5ML IJ SOSY
PREFILLED_SYRINGE | INTRAMUSCULAR | Status: AC
  Filled 2022-10-05: qty 5

## 2022-10-05 MED ORDER — ROCURONIUM BROMIDE 10 MG/ML (PF) SYRINGE
PREFILLED_SYRINGE | INTRAVENOUS | Status: AC
  Filled 2022-10-05: qty 10

## 2022-10-05 MED ORDER — ONDANSETRON HCL 4 MG/2ML IJ SOLN
INTRAMUSCULAR | Status: AC
  Filled 2022-10-05: qty 2

## 2022-10-05 MED ORDER — SIMETHICONE 80 MG PO CHEW
CHEWABLE_TABLET | ORAL | Status: AC
  Filled 2022-10-05: qty 1

## 2022-10-05 MED ORDER — CLINDAMYCIN PHOSPHATE 900 MG/50ML IV SOLN
INTRAVENOUS | Status: AC
  Filled 2022-10-05: qty 50

## 2022-10-05 MED ORDER — ALBUMIN HUMAN 5 % IV SOLN
INTRAVENOUS | Status: AC
  Filled 2022-10-05: qty 250

## 2022-10-05 MED ORDER — KETAMINE HCL 10 MG/ML IJ SOLN
INTRAMUSCULAR | Status: DC | PRN
  Administered 2022-10-05: 5 mg via INTRAVENOUS
  Administered 2022-10-05 (×2): 10 mg via INTRAVENOUS
  Administered 2022-10-05: 25 mg via INTRAVENOUS

## 2022-10-05 MED ORDER — DROPERIDOL 2.5 MG/ML IJ SOLN
INTRAMUSCULAR | Status: DC | PRN
  Administered 2022-10-05: 1.25 mg via INTRAVENOUS

## 2022-10-05 MED ORDER — ACETAMINOPHEN 500 MG PO TABS
1000.0000 mg | ORAL_TABLET | Freq: Once | ORAL | Status: AC
  Administered 2022-10-05: 1000 mg via ORAL

## 2022-10-05 MED ORDER — PROPOFOL 10 MG/ML IV BOLUS
INTRAVENOUS | Status: DC | PRN
  Administered 2022-10-05: 150 mg via INTRAVENOUS

## 2022-10-05 MED ORDER — ONDANSETRON HCL 4 MG/2ML IJ SOLN: 4.0000 mg | Freq: Four times a day (QID) | INTRAMUSCULAR | Status: DC | PRN

## 2022-10-05 MED ORDER — ONDANSETRON HCL 4 MG/2ML IJ SOLN: 4.0000 mg | Freq: Once | INTRAMUSCULAR | Status: DC | PRN

## 2022-10-05 MED ORDER — OXYCODONE HCL 5 MG PO TABS
5.0000 mg | ORAL_TABLET | ORAL | Status: DC | PRN
  Administered 2022-10-05 – 2022-10-06 (×4): 10 mg via ORAL

## 2022-10-05 MED ORDER — OXYCODONE HCL 5 MG/5ML PO SOLN: 5.0000 mg | Freq: Once | ORAL | Status: DC | PRN

## 2022-10-05 MED ORDER — HYDROMORPHONE HCL 1 MG/ML IJ SOLN: 0.2000 mg | INTRAMUSCULAR | Status: DC | PRN

## 2022-10-05 MED ORDER — HYDROXYZINE HCL 25 MG PO TABS: 25.0000 mg | ORAL_TABLET | Freq: Every evening | ORAL | Status: DC | PRN

## 2022-10-05 MED ORDER — MIDAZOLAM HCL 5 MG/5ML IJ SOLN
INTRAMUSCULAR | Status: DC | PRN
  Administered 2022-10-05: 2 mg via INTRAVENOUS

## 2022-10-05 MED ORDER — ALBUMIN HUMAN 5 % IV SOLN: INTRAVENOUS | Status: DC | PRN

## 2022-10-05 MED ORDER — SCOPOLAMINE 1 MG/3DAYS TD PT72
MEDICATED_PATCH | TRANSDERMAL | Status: AC
  Filled 2022-10-05: qty 1

## 2022-10-05 MED ORDER — LIDOCAINE HCL (PF) 2 % IJ SOLN
INTRAMUSCULAR | Status: AC
  Filled 2022-10-05: qty 10

## 2022-10-05 MED ORDER — FENTANYL CITRATE (PF) 100 MCG/2ML IJ SOLN
INTRAMUSCULAR | Status: DC | PRN
  Administered 2022-10-05 (×2): 50 ug via INTRAVENOUS

## 2022-10-05 MED ORDER — ONDANSETRON HCL 4 MG PO TABS: 4.0000 mg | ORAL_TABLET | Freq: Four times a day (QID) | ORAL | Status: DC | PRN

## 2022-10-05 MED ORDER — KETOROLAC TROMETHAMINE 30 MG/ML IJ SOLN
30.0000 mg | Freq: Once | INTRAMUSCULAR | Status: AC | PRN
  Administered 2022-10-05: 30 mg via INTRAVENOUS

## 2022-10-05 MED ORDER — LACTATED RINGERS IV SOLN: INTRAVENOUS | Status: DC | PRN

## 2022-10-05 MED ORDER — SUGAMMADEX SODIUM 200 MG/2ML IV SOLN
INTRAVENOUS | Status: DC | PRN
  Administered 2022-10-05: 150 mg via INTRAVENOUS

## 2022-10-05 MED ORDER — SODIUM CHLORIDE 0.9 % IR SOLN
Status: DC | PRN
  Administered 2022-10-05: 1000 mL

## 2022-10-05 MED ORDER — ONDANSETRON HCL 4 MG/2ML IJ SOLN
INTRAMUSCULAR | Status: DC | PRN
  Administered 2022-10-05: 4 mg via INTRAVENOUS

## 2022-10-05 MED ORDER — SIMETHICONE 80 MG PO CHEW
80.0000 mg | CHEWABLE_TABLET | Freq: Four times a day (QID) | ORAL | Status: DC | PRN
  Administered 2022-10-05: 80 mg via ORAL

## 2022-10-05 MED ORDER — HYDROMORPHONE HCL 1 MG/ML IJ SOLN
0.2500 mg | INTRAMUSCULAR | Status: DC | PRN
  Administered 2022-10-05 (×4): 0.5 mg via INTRAVENOUS

## 2022-10-05 MED ORDER — PANTOPRAZOLE SODIUM 40 MG PO TBEC
DELAYED_RELEASE_TABLET | ORAL | Status: AC
  Filled 2022-10-05: qty 1

## 2022-10-05 MED ORDER — SCOPOLAMINE 1 MG/3DAYS TD PT72
1.0000 | MEDICATED_PATCH | TRANSDERMAL | Status: DC
  Administered 2022-10-05: 1.5 mg via TRANSDERMAL

## 2022-10-05 MED ORDER — MIDAZOLAM HCL 2 MG/2ML IJ SOLN
INTRAMUSCULAR | Status: AC
  Filled 2022-10-05: qty 2

## 2022-10-05 MED ORDER — OXYCODONE HCL 5 MG PO TABS: 5.0000 mg | ORAL_TABLET | Freq: Once | ORAL | Status: DC | PRN

## 2022-10-05 MED ORDER — MENTHOL 3 MG MT LOZG
LOZENGE | OROMUCOSAL | Status: AC
  Filled 2022-10-05: qty 9

## 2022-10-05 MED ORDER — MENTHOL 3 MG MT LOZG
1.0000 | LOZENGE | OROMUCOSAL | Status: DC | PRN
  Administered 2022-10-05: 3 mg via ORAL

## 2022-10-05 SURGICAL SUPPLY — 67 items
ADH SKN CLS APL DERMABOND .7 (GAUZE/BANDAGES/DRESSINGS) ×2
APL PRP STRL LF DISP 70% ISPRP (MISCELLANEOUS) ×2
BARRIER ADHS 3X4 INTERCEED (GAUZE/BANDAGES/DRESSINGS) IMPLANT
BLADE SURG SZ10 CARB STEEL (BLADE) IMPLANT
BRR ADH 4X3 ABS CNTRL BYND (GAUZE/BANDAGES/DRESSINGS)
CHLORAPREP W/TINT 26 (MISCELLANEOUS) ×2 IMPLANT
CNTNR URN SCR LID CUP LEK RST (MISCELLANEOUS) ×2 IMPLANT
CONT SPEC 4OZ STRL OR WHT (MISCELLANEOUS) ×2
COVER BACK TABLE 60X90IN (DRAPES) ×2 IMPLANT
COVER TIP SHEARS 8 DVNC (MISCELLANEOUS) ×2 IMPLANT
COVER TIP SHEARS 8MM DA VINCI (MISCELLANEOUS) ×2
DEFOGGER SCOPE WARMER CLEARIFY (MISCELLANEOUS) ×2 IMPLANT
DERMABOND ADVANCED .7 DNX12 (GAUZE/BANDAGES/DRESSINGS) ×2 IMPLANT
DRAPE ARM DVNC X/XI (DISPOSABLE) ×8 IMPLANT
DRAPE COLUMN DVNC XI (DISPOSABLE) ×2 IMPLANT
DRAPE DA VINCI XI ARM (DISPOSABLE) ×8
DRAPE DA VINCI XI COLUMN (DISPOSABLE) ×2
DRAPE SURG IRRIG POUCH 19X23 (DRAPES) ×2 IMPLANT
DRAPE UTILITY XL STRL (DRAPES) ×2 IMPLANT
ELECT REM PT RETURN 9FT ADLT (ELECTROSURGICAL) ×2
ELECTRODE REM PT RTRN 9FT ADLT (ELECTROSURGICAL) ×2 IMPLANT
GAUZE 4X4 16PLY ~~LOC~~+RFID DBL (SPONGE) ×4 IMPLANT
GAUZE VASELINE FOILPK 1/2 X 72 (GAUZE/BANDAGES/DRESSINGS) IMPLANT
GLOVE BIO SURGEON STRL SZ 6.5 (GLOVE) ×6 IMPLANT
GLOVE BIOGEL PI IND STRL 7.0 (GLOVE) ×10 IMPLANT
HOLDER FOLEY CATH W/STRAP (MISCELLANEOUS) IMPLANT
IRRIG SUCT STRYKERFLOW 2 WTIP (MISCELLANEOUS) ×2
IRRIGATION SUCT STRKRFLW 2 WTP (MISCELLANEOUS) ×2 IMPLANT
IV NS 1000ML (IV SOLUTION) ×2
IV NS 1000ML BAXH (IV SOLUTION) ×2 IMPLANT
KIT PINK PAD W/HEAD ARE REST (MISCELLANEOUS) ×2
KIT PINK PAD W/HEAD ARM REST (MISCELLANEOUS) ×2 IMPLANT
KIT TURNOVER CYSTO (KITS) ×2 IMPLANT
LEGGING LITHOTOMY PAIR STRL (DRAPES) ×2 IMPLANT
NS IRRIG 500ML POUR BTL (IV SOLUTION) IMPLANT
OBTURATOR OPTICAL STANDARD 8MM (TROCAR) ×2
OBTURATOR OPTICAL STND 8 DVNC (TROCAR) ×2
OBTURATOR OPTICALSTD 8 DVNC (TROCAR) ×2 IMPLANT
OCCLUDER COLPOPNEUMO (BALLOONS) ×2 IMPLANT
PACK ROBOT WH (CUSTOM PROCEDURE TRAY) ×2 IMPLANT
PACK ROBOTIC GOWN (GOWN DISPOSABLE) ×2 IMPLANT
PAD OB MATERNITY 4.3X12.25 (PERSONAL CARE ITEMS) ×2 IMPLANT
PAD PREP 24X48 CUFFED NSTRL (MISCELLANEOUS) ×2 IMPLANT
PROTECTOR NERVE ULNAR (MISCELLANEOUS) ×4 IMPLANT
SEAL CANN UNIV 5-8 DVNC XI (MISCELLANEOUS) ×8 IMPLANT
SEAL XI 5MM-8MM UNIVERSAL (MISCELLANEOUS) ×8
SEALER VESSEL DA VINCI XI (MISCELLANEOUS) ×2
SEALER VESSEL EXT DVNC XI (MISCELLANEOUS) ×2 IMPLANT
SET TRI-LUMEN FLTR TB AIRSEAL (TUBING) ×2 IMPLANT
SLEEVE SCD COMPRESS KNEE MED (STOCKING) ×2 IMPLANT
SPIKE FLUID TRANSFER (MISCELLANEOUS) ×4 IMPLANT
SPONGE T-LAP 4X18 ~~LOC~~+RFID (SPONGE) ×2 IMPLANT
SUT VIC AB 0 CT1 27 (SUTURE) ×4
SUT VIC AB 0 CT1 27XBRD ANBCTR (SUTURE) ×4 IMPLANT
SUT VIC AB 4-0 PS2 27 (SUTURE) ×4 IMPLANT
SUT VICRYL 0 UR6 27IN ABS (SUTURE) ×2 IMPLANT
SUT VLOC 180 0 9IN  GS21 (SUTURE) ×2
SUT VLOC 180 0 9IN GS21 (SUTURE) ×2 IMPLANT
SYSTEM CARTER THOMASON II (TROCAR) IMPLANT
TIP RUMI ORANGE 6.7MMX12CM (TIP) IMPLANT
TIP UTERINE 5.1X6CM LAV DISP (MISCELLANEOUS) IMPLANT
TIP UTERINE 6.7X10CM GRN DISP (MISCELLANEOUS) IMPLANT
TIP UTERINE 6.7X6CM WHT DISP (MISCELLANEOUS) IMPLANT
TIP UTERINE 6.7X8CM BLUE DISP (MISCELLANEOUS) IMPLANT
TOWEL OR 17X24 6PK STRL BLUE (TOWEL DISPOSABLE) ×2 IMPLANT
TRAY FOLEY W/BAG SLVR 14FR LF (SET/KITS/TRAYS/PACK) ×2 IMPLANT
TROCAR PORT AIRSEAL 5X120 (TROCAR) ×2 IMPLANT

## 2022-10-05 NOTE — Brief Op Note (Signed)
10/05/2022  4:12 PM  PATIENT:  Jacqueline Barber  37 y.o. female  PRE-OPERATIVE DIAGNOSIS:  Fibroids  POST-OPERATIVE DIAGNOSIS:  Fibroids  PROCEDURE:  Procedure(s): XI ROBOTIC ASSISTED LAPAROSCOPIC HYSTERECTOMY AND SALPINGECTOMY (Bilateral) INTRAUTERINE DEVICE (IUD) REMOVAL (N/A)  SURGEON:  Surgeon(s) and Role:    Aloha Gell, MD - Primary  PHYSICIAN ASSISTANT:   ASSISTANTS: Derrell Lolling, CNM  ANESTHESIA:    As above  EBL:  50 mL   BLOOD ADMINISTERED:none  DRAINS: Urinary Catheter (Foley)   LOCAL MEDICATIONS USED:  MARCAINE    and BUPIVICAINE   SPECIMEN:  Source of Specimen:  Uterus with fibroids, cervix, bilateral fallopian tubes  DISPOSITION OF SPECIMEN:  PATHOLOGY  COUNTS:  YES  TOURNIQUET:  * No tourniquets in log *  DICTATION: .Note written in EPIC  PLAN OF CARE: Admit for overnight observation  PATIENT DISPOSITION:  PACU - hemodynamically stable.   Delay start of Pharmacological VTE agent (>24hrs) due to surgical blood loss or risk of bleeding: yes

## 2022-10-05 NOTE — Anesthesia Preprocedure Evaluation (Signed)
Anesthesia Evaluation  Patient identified by MRN, date of birth, ID band Patient awake    Reviewed: Allergy & Precautions, H&P , NPO status , Patient's Chart, lab work & pertinent test results  Airway Mallampati: II  TM Distance: >3 FB Neck ROM: Full    Dental no notable dental hx.    Pulmonary neg pulmonary ROS, former smoker   Pulmonary exam normal breath sounds clear to auscultation       Cardiovascular Normal cardiovascular exam Rhythm:Regular Rate:Normal     Neuro/Psych   Anxiety Depression    negative neurological ROS     GI/Hepatic negative GI ROS, Neg liver ROS,,,  Endo/Other  negative endocrine ROS    Renal/GU negative Renal ROS  negative genitourinary   Musculoskeletal negative musculoskeletal ROS (+)    Abdominal   Peds negative pediatric ROS (+)  Hematology  (+) Blood dyscrasia, anemia   Anesthesia Other Findings   Reproductive/Obstetrics negative OB ROS                             Anesthesia Physical Anesthesia Plan  ASA: 2  Anesthesia Plan: General   Post-op Pain Management: Tylenol PO (pre-op)*   Induction: Intravenous  PONV Risk Score and Plan: 3 and Ondansetron, Dexamethasone, Droperidol, Treatment may vary due to age or medical condition and Scopolamine patch - Pre-op  Airway Management Planned: Oral ETT  Additional Equipment:   Intra-op Plan:   Post-operative Plan: Extubation in OR  Informed Consent: I have reviewed the patients History and Physical, chart, labs and discussed the procedure including the risks, benefits and alternatives for the proposed anesthesia with the patient or authorized representative who has indicated his/her understanding and acceptance.     Dental advisory given  Plan Discussed with: CRNA and Surgeon  Anesthesia Plan Comments:        Anesthesia Quick Evaluation

## 2022-10-05 NOTE — Anesthesia Procedure Notes (Signed)
Procedure Name: Intubation Date/Time: 10/05/2022 1:48 PM  Performed by: Marlis Oldaker D, CRNAPre-anesthesia Checklist: Patient identified, Emergency Drugs available, Suction available, Patient being monitored and Timeout performed Patient Re-evaluated:Patient Re-evaluated prior to induction Oxygen Delivery Method: Circle system utilized Induction Type: IV induction Ventilation: Mask ventilation without difficulty Laryngoscope Size: Mac and 3 Grade View: Grade II Tube type: Oral Tube size: 7.0 mm Number of attempts: 1 Airway Equipment and Method: Stylet Placement Confirmation: ETT inserted through vocal cords under direct vision, positive ETCO2 and breath sounds checked- equal and bilateral Secured at: 22 cm Tube secured with: Tape Dental Injury: Teeth and Oropharynx as per pre-operative assessment

## 2022-10-05 NOTE — Op Note (Signed)
10/05/2022  4:12 PM  PATIENT:  Jacqueline Barber  37 y.o. female  PRE-OPERATIVE DIAGNOSIS:  Fibroids  POST-OPERATIVE DIAGNOSIS:  Fibroids  PROCEDURE:  Procedure(s): XI ROBOTIC ASSISTED LAPAROSCOPIC HYSTERECTOMY AND SALPINGECTOMY (Bilateral) INTRAUTERINE DEVICE (IUD) REMOVAL (N/A)  SURGEON:  Surgeon(s) and Role:    Aloha Gell, MD - Primary  PHYSICIAN ASSISTANT:   ASSISTANTS: Derrell Lolling, CNM  ANESTHESIA:   As above  EBL:  50 mL   BLOOD ADMINISTERED:none  DRAINS: Urinary Catheter (Foley)   LOCAL MEDICATIONS USED:  MARCAINE    and BUPIVICAINE   SPECIMEN:  Source of Specimen:  Uterus with fibroids, cervix, bilateral fallopian tubes  DISPOSITION OF SPECIMEN:  PATHOLOGY  COUNTS:  YES  TOURNIQUET:  * No tourniquets in log *  DICTATION: .Note written in EPIC  PLAN OF CARE: Admit for overnight observation  PATIENT DISPOSITION:  PACU - hemodynamically stable.   Delay start of Pharmacological VTE agent (>24hrs) due to surgical blood loss or risk of bleeding: yes   Procedure:Robotic-assisted total laparoscopic hysterectomy with bilateral salpingectomy and ovarian retention. Complications: none Antibiotics: Gentamicin and clindamycin Findings: Enlarged fibroid  uterus, normal b/l ovaries and tubes, left ovary with simple cyst which was ruptured and drained, nl gallbladder, nl liver edge, Hemostasis post-procedure with peristalsis of bilateral ureters.  Uterine weight 486 g  Indications: This is a 37 year old G3, P2 with known fibroid uterus causing menorrhagia and urinary retention and pelvic pain.  After failing conservative options patient opted for definitive hysterectomy.  Risk benefits of all were reviewed..   Procedure: After informed consent obtained, the patient was taken to the operating room where general anesthesia was initiated without difficulty.   She was prepped and draped in normal sterile fashion in the dorsal supine lithotomy position. A Foley  catheter was inserted sterilely into the bladder. A bimanual examination was done to assess the size and position of the uterus. A weighted speculum was placed in the vagina and deaver retractors were used on the anterior vaginal wall. .The cervix was grasped with tenaculum. Cervix was dilated and sounded. The cervix was assessed to identify the Rumi-Co size.  A small cup and an 8 cm shaft was used. The uterine balloon was inflated.  Gloved were changed. Attention was then turned to the patient's abdomen. 0.5 % marcaine was used prior to all incision. A total of 10 cc of marcaine was used.  A 10 mm incision was made in the umbilicus and blunt and sharp dissection was done until the fascia was identified. This was then grasped with Kocher clamps x2 and entered sharply. A pursestring suture of 0 Vicryl was then placed along the incision and a non-bladed Sheryle Hail was inserted into the peritoneal cavity. Intraperitoneal placement was confirmed with the use of the camera and pneumoperitoneum was created to 15 mm of mercury. The pursestring suture was secured around the port and pneumoperitoneum was maintained. Brief survey of the abdomen and pelvis was done with findings as above. The abdominal wall was assessed and additional port sites were marked. 8 mm incisions were placed in the right (two) and left lower quadrants (2) and non-bladed ports were placed under direct visualization.  Placing the camera through the lateral port I reassessed the umbilicus the port and found it to be through a small omental adhesion.  No bowel was involved and this was hemostatic.  The camera was then removed.  The robot was brought to the patient's side and attached with the right side docking. The robotic  instruments were placed under direct visualization until proper placement just over the uterus.   I then went to the robotic console. Brief survey of the patient's abdomen and pelvis revealed  findings as above. Bilateral tubes and  ovaries were normal.  Uterus with large posterior fibroids filled the lower pelvis.  No significant adhesions were noted.  I began on the left side: the  Left ureter and  IP were identified. The left fallopian tube was serially cauterized and transected along the mesosalpynx with the vessel sealer. The left round ligament was cauterized and divided.  The left uterine-ovarian ligament was sealed and transected. The anterior leaf of the broad ligament was dissected bluntly then monopolar cautery was used to separate the anterior and posterior leafs of the broad ligament. This was carried down through to the bladder flap. Attention was then turned to the patient's  Right side: the right fallopian tube was serially cauterized and transected along the mesosalpynx.  The right round  ligament was cauterized and transected. The right uterine-ovarian ligament was sealed and cauterized. The anterior and posterior leaves of the broad ligament were bluntly and sharply dissected apart from each other. Monopolar cautery was used to come across the anterior leaf of the right broad ligament. This dissection was carried down across to the midline to create the bladder flap. The left uterine artery was serially cauterized with the vessel sealer and transected.  The right uterine artery and then serially cauterized with the vessel sealer and transected. The uterus was placed on stretch to allow better visualization of the arteries. The bladder flap was further taken down both sharply and with cautery. Cautery was used for hemostasis on several places along the bladder flap. At this point with the pressure inward on the Rumi, the anterior colpotomy was made with the monopolar scissors. This was carried around to the patient's right side. Additional bipolar cautery was used on the  both angles of the cuff- this was carried out with the vessel sealer. The uterus was then positioned  anteriorly  to allow easy access to the posterior   colpotomy. This was carried out with the monopolar scissors.  Good hemostasis was noted along the angles of the incision.  With manipulation of the specimen, the uterus, cervix, and bilateral tubes were pulled out through the vagina.   Irrigation was used and the vaginal cuff appeared hemostatic. A 9 inch V-lock suture was placed though the umbilical port. Instruments were changed to allow a suture cut needle driver through the #1 port and and a long-tipped forceps through the #2 port. The right angle was closed and locked with the V-lock suture. Running suture was carried out tothe L angle.  A more superficial layer of suture was placed travelling back to the right angle. The suture was cut and removed. Excellent hemostasis was noted. Suction irrigation was carried out and hemostasis was assured along the vaginal cuff. The pedicles were reassessed also found to be hemostatic. All free fluid was removed from the abdomen. The robotic portion was completed. The robot was undocked.   By standard laparoscopy the pelvis was irrigated and evaluated. The patient was placed in reverse Trendelenburg, all additional fluid was suctioned from the abdomen and pelvis the cuff was reinspected and  found to be hemostatic. All pedicles were also found to be hemostatic. 20 CC 1/4% bupivicaine was placed into the peritoneal cavity.  Under direct visualization the ports were removed. Pneumoperitoneum was released and the umbilical port was removed.  The  pursestring suture at the umbilicus was then closed. No additional fascial incisions were closed due to the small size and the nondilated nature of these incisions. A 4-0 Vicryl was used to close the additional laparoscopic ports sites. Good hemostasis was noted.  Sponge lap and needle counts were correct x3 the patient was woken from general anesthesia having tolerated the procedure well and taken to the recovery room in a stable fashion.

## 2022-10-05 NOTE — Transfer of Care (Signed)
Immediate Anesthesia Transfer of Care Note  Patient: Jacqueline Barber  Procedure(s) Performed: XI ROBOTIC ASSISTED LAPAROSCOPIC HYSTERECTOMY AND SALPINGECTOMY (Bilateral: Pelvis) INTRAUTERINE DEVICE (IUD) REMOVAL (Uterus)  Patient Location: PACU  Anesthesia Type:General  Level of Consciousness: awake, alert , and oriented  Airway & Oxygen Therapy: Patient Spontanous Breathing and Patient connected to nasal cannula oxygen  Post-op Assessment: Report given to RN and Post -op Vital signs reviewed and stable  Post vital signs: Reviewed and stable  Last Vitals:  Vitals Value Taken Time  BP 135/83 10/05/22 1617  Temp 36.8 C 10/05/22 1617  Pulse 90 10/05/22 1619  Resp 25 10/05/22 1619  SpO2 100 % 10/05/22 1619  Vitals shown include unvalidated device data.  Last Pain:  Vitals:   10/05/22 1205  TempSrc: Oral         Complications: No notable events documented.

## 2022-10-05 NOTE — Anesthesia Postprocedure Evaluation (Signed)
Anesthesia Post Note  Patient: Jacqueline Barber  Procedure(s) Performed: XI ROBOTIC ASSISTED LAPAROSCOPIC HYSTERECTOMY AND SALPINGECTOMY (Bilateral: Pelvis) INTRAUTERINE DEVICE (IUD) REMOVAL (Uterus)     Patient location during evaluation: PACU Anesthesia Type: General Level of consciousness: sedated and patient cooperative Pain management: pain level controlled Vital Signs Assessment: post-procedure vital signs reviewed and stable Respiratory status: spontaneous breathing Cardiovascular status: stable Anesthetic complications: no   No notable events documented.  Last Vitals:  Vitals:   10/05/22 1813 10/05/22 1855  BP: 132/83 129/82  Pulse: 92 91  Resp: 17 16  Temp: 36.7 C 36.8 C  SpO2: 99% 96%    Last Pain:  Vitals:   10/05/22 1937  TempSrc:   PainSc: [place]

## 2022-10-05 NOTE — H&P (Signed)
Chief complaint: Menorrhagia, anemia, fibroid uterus  History of present illness: 37 year old G4 P3-0-1-3 nonpregnant patient contraception with IUD presents for total laparoscopic hysterectomy and bilateral salpingectomy with ovarian retention for fibroids and menorrhagia.  Patient is failing with IUD.  Limited response to listing and does not want continued hormonal exposure.  Patient had endometrial biopsy October 2023 with a proliferative phase endometrium with no hyperplasia.  Patient had a normal Pap and HPV - November 2023.  Patient has no family history of breast ovarian or colon cancer patient had ultrasound May 23 uterus 9 x 8 x 7 cm 238 cc with 11 mm stripe and several fibroids 3 to 6 cm each.  She had a sonohysterogram October 2023 with no discrete cavitary lesion and a growing uterine volume of 350 cc.  Over the last few months patient has had increasing episodes of unexplained dizziness with a PAC normal evaluation by her primary care doctor and for neurologist.  However on preoperative labs her hemoglobin was noted to be 8.8  Medications: Wellbutrin, fluoxetine and hydroxyzine as needed sleep Allergies Bactrim causes Stevens-Johnson syndrome, sulfa, penicillin, cephalosporin  Past Medical History:  Diagnosis Date   Anxiety    Follows w/ Noralyn Pick, NP @ [facility] in [place].   Depression    Follows w/ Noralyn Pick, NP @ [facility] in [place].   Headache    Hemorrhoid    during pregnancy   Menorrhagia    Migraines    occasional   Postpartum care following vaginal delivery (06/27/12) M 06/28/2012   Vaginal bleeding in pregnant patient at less than [redacted] weeks gestation 08/22/2014   Vaginal Pap smear, abnormal    Past Surgical History:  Procedure Laterality Date   DILATION AND EVACUATION N/A 05/02/2019   Procedure: DILATATION AND EVACUATION (D&E) 2ND TRIMESTER;  Surgeon: Aloha Gell, MD;  Location: [facility];  Service:  Gynecology;  Laterality: N/A;   OPERATIVE ULTRASOUND N/A 05/02/2019   Procedure: OPERATIVE ULTRASOUND;  Surgeon: Aloha Gell, MD;  Location: [facility];  Service: Gynecology;  Laterality: N/A;   Physical exam: Vitals:   10/05/22 1205  BP: (!) 146/94  Pulse: 84  Resp: 15  Temp: 98.3 F (36.8 C)  SpO2: 99%   General: Anxious, no distress Abdomen: Soft, nontender, nondistended GU: Deferred to the OR Extremity, nontender, no edema  CBC    Component Value Date/Time   WBC 6.8 10/03/2022 0826   RBC 4.45 10/03/2022 0826   HGB 8.8 (L) 10/03/2022 0826   HGB 13.4 03/15/2021 0821   HCT 30.9 (L) 10/03/2022 0826   HCT 40.8 03/15/2021 0821   PLT 316 10/03/2022 0826   PLT 240 03/15/2021 0821   MCV 69.4 (L) 10/03/2022 0826   MCV 93 03/15/2021 0821   MCH 19.8 (L) 10/03/2022 0826   MCHC 28.5 (L) 10/03/2022 0826   RDW 21.3 (H) 10/03/2022 0826   RDW 11.8 03/15/2021 0821   LYMPHSABS 2.0 03/15/2021 0821   MONOABS 0.3 01/09/2016 1617   EOSABS 0.2 03/15/2021 0821   BASOSABS 0.0 03/15/2021 [id number]    Assessment plan: 37 year old G4, P3 with no desire for further pregnancies with known fibroid uterus causing menorrhagia, anemia and pelvic pain.  Patient has failed other modalities and presents for definitive surgery.  Patient understands risks of surgery including increased risk of proceeding with surgery with a hemoglobin of 8.8.  She understands her uterine size and shape may put her at increased risk of needing an open surgery or  prolonged vaginal morcellation.  Patient understands fertility impacts of hysterectomy and young age though her husband already has had a vasectomy.  Patient consents to blood if needed.  Patient took her antidepressant and antianxiety medicines this morning will repeat tomorrow morning hopefully at home after an overnight observation.  ORLEAN DOUSE 10/05/2022 1:38 PM

## 2022-10-06 ENCOUNTER — Encounter (HOSPITAL_BASED_OUTPATIENT_CLINIC_OR_DEPARTMENT_OTHER): Payer: Self-pay | Admitting: Obstetrics

## 2022-10-06 DIAGNOSIS — D259 Leiomyoma of uterus, unspecified: Secondary | ICD-10-CM | POA: Diagnosis not present

## 2022-10-06 LAB — CBC
HCT: 27.5 % — ABNORMAL LOW (ref 36.0–46.0)
Hemoglobin: 8 g/dL — ABNORMAL LOW (ref 12.0–15.0)
MCH: 19.9 pg — ABNORMAL LOW (ref 26.0–34.0)
MCHC: 29.1 g/dL — ABNORMAL LOW (ref 30.0–36.0)
MCV: 68.4 fL — ABNORMAL LOW (ref 80.0–100.0)
Platelets: 275 10*3/uL (ref 150–400)
RBC: 4.02 MIL/uL (ref 3.87–5.11)
RDW: 19.9 % — ABNORMAL HIGH (ref 11.5–15.5)
WBC: 10.4 10*3/uL (ref 4.0–10.5)
nRBC: 0 % (ref 0.0–0.2)

## 2022-10-06 MED ORDER — IBUPROFEN 200 MG PO TABS
ORAL_TABLET | ORAL | Status: AC
  Filled 2022-10-06: qty 3

## 2022-10-06 MED ORDER — KETOROLAC TROMETHAMINE 30 MG/ML IJ SOLN
INTRAMUSCULAR | Status: AC
  Filled 2022-10-06: qty 1

## 2022-10-06 MED ORDER — OXYCODONE HCL 5 MG PO TABS
ORAL_TABLET | ORAL | Status: AC
  Filled 2022-10-06: qty 2

## 2022-10-06 MED ORDER — ACETAMINOPHEN 500 MG PO TABS
ORAL_TABLET | ORAL | Status: AC
  Filled 2022-10-06: qty 2

## 2022-10-06 MED ORDER — ACETAMINOPHEN 500 MG PO TABS: 1000.0000 mg | ORAL_TABLET | Freq: Four times a day (QID) | ORAL | 0 refills | Status: AC

## 2022-10-06 MED ORDER — IBUPROFEN 600 MG PO TABS: 600.0000 mg | ORAL_TABLET | Freq: Four times a day (QID) | ORAL | 0 refills | Status: AC

## 2022-10-06 MED ORDER — IRON SUCROSE 500 MG IVPB - SIMPLE MED
500.0000 mg | Freq: Once | INTRAVENOUS | Status: AC
  Administered 2022-10-06: 500 mg via INTRAVENOUS
  Filled 2022-10-06: qty 500

## 2022-10-06 MED ORDER — OXYCODONE HCL 5 MG PO TABS: 5.0000 mg | ORAL_TABLET | ORAL | 0 refills | Status: AC | PRN

## 2022-10-06 NOTE — Discharge Summary (Signed)
Jacqueline Barber MRN: [id number] DOB/AGE: 1986-03-10 37 y.o.  Admit date: 10/05/2022 Discharge date: October 06, 2022  Admission Diagnoses: Uterine fibroid [D25.9] S/P hysterectomy [Z90.710]  Discharge Diagnoses: Uterine fibroid [D25.9] S/P hysterectomy [Z90.710]        Principal Problem:   Uterine fibroid Active Problems:   S/P hysterectomy   Discharged Condition: stable  Hospital Course: Patient admitted for planned robotic assisted total laparoscopic hysterectomy with bilateral salpingectomy and ovarian retention.  See operative notes for details.  Surgery went as expected without significant complications.  On hospital day 1 patient notes pelvic pressure but pain controlled with oral medications.  Patient ambulated and voided.  Patient tolerated regular p.o. without nausea or vomiting.  Patient notes minimal vaginal bleeding.  Patient denies chest pain or shortness of breath.  No flatus yet.  On postoperative day #1 Vitals:   10/05/22 1855 10/05/22 2215 10/06/22 0215 10/06/22 0445  BP: 129/82 135/78 123/78 127/78  Pulse: 91 88 85 82  Resp: '16 14 14 14  '$ Temp: 98.2 F (36.8 C) 98.5 F (36.9 C) 98.3 F (36.8 C) 98.3 F (36.8 C)  TempSrc:      SpO2: 96% 98% 100% 97%  Weight:      Height:       General: Well-appearing, no distress Skin: Warm and dry Incision: Intact, minimal bruising, covered with Dermabond GU: Minimal staining Abdomen: Soft distention, appropriately tender, no rebound, no guarding Lower extremity: Nontender, no edema  Consults: None  Treatments: surgery: Robotic assisted total laparoscopic hysterectomy with bilateral salpingectomy  Disposition: Discharge disposition: 01-Home or Self Care        Allergies as of 10/06/2022       Reactions   Bactrim [sulfamethoxazole-trimethoprim] Hives, Other (See Comments)   High fever   Sulfa Antibiotics Hives, Other (See Comments)   High fever   Amoxicillin Rash   Has patient had a PCN reaction causing  immediate rash, facial/tongue/throat swelling, SOB or lightheadedness with hypotension: Yes Has patient had a PCN reaction causing severe rash involving mucus membranes or skin necrosis: No Has patient had a PCN reaction that required hospitalization No Has patient had a PCN reaction occurring within the last 10 years: No If all of the above answers are "NO", then may proceed with Cephalosporin use.   Cephalexin Rash        Medication List     TAKE these medications    acetaminophen 500 MG tablet Commonly known as: TYLENOL Take 2 tablets (1,000 mg total) by mouth every 6 (six) hours.   buPROPion 300 MG 24 hr tablet Commonly known as: WELLBUTRIN XL Take 300 mg by mouth daily.   FLUoxetine 40 MG capsule Commonly known as: PROZAC Take 40 mg by mouth daily.   hydrOXYzine 25 MG capsule Commonly known as: VISTARIL Take 25 mg by mouth at bedtime.   ibuprofen 600 MG tablet Commonly known as: ADVIL Take 1 tablet (600 mg total) by mouth every 6 (six) hours.   oxyCODONE 5 MG immediate release tablet Commonly known as: Oxy IR/ROXICODONE Take 1-2 tablets (5-10 mg total) by mouth every 4 (four) hours as needed for moderate pain.         Signed: Ala Dach, MD 10/06/2022, 8:21 AM

## 2022-10-09 LAB — SURGICAL PATHOLOGY

## 2022-12-19 ENCOUNTER — Encounter: Payer: Self-pay | Admitting: Neurology

## 2022-12-19 ENCOUNTER — Ambulatory Visit: Payer: BC Managed Care – PPO | Admitting: Neurology

## 2022-12-19 VITALS — BP 121/83 | HR 76 | Ht 62.0 in | Wt 154.2 lb

## 2022-12-19 DIAGNOSIS — R42 Dizziness and giddiness: Secondary | ICD-10-CM | POA: Diagnosis not present

## 2022-12-19 DIAGNOSIS — D500 Iron deficiency anemia secondary to blood loss (chronic): Secondary | ICD-10-CM | POA: Diagnosis not present

## 2022-12-19 DIAGNOSIS — G43009 Migraine without aura, not intractable, without status migrainosus: Secondary | ICD-10-CM | POA: Diagnosis not present

## 2022-12-19 NOTE — Progress Notes (Signed)
Subjective:    Patient ID: Jacqueline Barber is a 37 y.o. female.  HPI    Interim history:   Jacqueline Barber is a 37 year old right-handed woman with an underlying medical history of gestational hypertension, anxiety, reflux disease, and overweight state, who presents for follow-up consultation of her migraine headaches.  The patient is unaccompanied today.  Jacqueline Barber presents after a gap of over 1-1/2 years.  I first met her at the request of her OB/GYN on 03/15/2021, at which time Jacqueline Barber reported a several month history of intermittent dizziness.  Jacqueline Barber reported a history of migraine headaches.  Neurological exam was nonfocal.  Jacqueline Barber had infrequent migraines.  Jacqueline Barber was still breast-feeding at the time and we talked about potentially utilizing as needed medications for migraine headaches.  Today, 12/19/2022: Jacqueline Barber reports ongoing issues with intermittent dizziness but actually lately Jacqueline Barber feels a little better.  Of note, Jacqueline Barber had hysterectomy on 10/05/2022, Jacqueline Barber had significant anemia, her H/H were as low as 8/27.5.  Jacqueline Barber had some medication changes through her mental health provider, Jacqueline Barber is no longer on Wellbutrin, Jacqueline Barber is on 60 mg rather than 40 mg of Prozac.  Jacqueline Barber takes hydroxyzine as needed, Jacqueline Barber has been on Adderall 20 mg daily, typically takes it during the workweek, 5 days a week.  Jacqueline Barber sees a mental health provider online.  Jacqueline Barber tries to hydrate well and limits her caffeine to 1-2 servings per day.  Jacqueline Barber drinks alcohol occasionally, on the weekends, not during the workweek.  Jacqueline Barber has a regular eye exam once a year, Jacqueline Barber has had intermittent migraines, takes Imitrex as needed per GYN.  Jacqueline Barber would like to pursue the brain MRI just to make sure.  Jacqueline Barber has not had any sudden onset of one-sided weakness or numbness or tingling with the exception of occasional facial tingling with her migraine.  Jacqueline Barber continues to work full-time, Jacqueline Barber lives with her family and has 3 children, ages 51, 73, and 2.  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  Jacqueline Barber works mostly from home.   The patient's allergies, current medications, family history, past medical history, past social history, past surgical history and problem list were reviewed and updated as appropriate.   Previously:   03/15/21: (Jacqueline Barber) reports a several month history of intermittent dizziness.  This can happen at random, while sitting or standing or walking.  Jacqueline Barber feels a nonspecific symptom of either lightheadedness or off-balance, feels like Jacqueline Barber could pass out at times or sometimes Jacqueline Barber just feels like Jacqueline Barber could fall.  Jacqueline Barber denies any actual spinning or swaying sensation, no associated nausea or vomiting, typically no headache with it.  1 episode of dizziness resulted in facial tingling which happened while Jacqueline Barber was sitting on the couch.  Symptoms resolved spontaneously after a few minutes, Jacqueline Barber did not have one-sided numbness or tingling but it felt like her whole face was tingling.  Jacqueline Barber has had some associated anxiety and was recently started on BuSpar for anxiety management.  Perhaps her dizzy spells are little bit less in frequency but anxiety is about the same by her estimate.  I reviewed your office note from 12/03/20.  Jacqueline Barber had blood work through your office in March 2022.  I was able to review the results from 11/01/2020, CMP was unremarkable, TSH normal, CBC with differential normal.  Jacqueline Barber has not actually passed out or fallen thankfully.  Jacqueline Barber had a recent eye exam within the past 6 to 7 months and Jacqueline Barber was  told that her eyes were not fully aligned but no further explanation was given.  Jacqueline Barber does not have any significant eye related complaints.  Jacqueline Barber does have migraines which are thankfully infrequent every few months.  Jacqueline Barber feels that her migraine frequency has actually improved over time.  Jacqueline Barber has a visual aura at times.  Jacqueline Barber has not had any sudden onset of one-sided weakness or numbness or tingling or droopy face or severe/thunderclap headache.  Jacqueline Barber has 3 children, Jacqueline Barber is  currently weaning her youngest child of breastmilk.  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  Jacqueline Barber is a non-smoker and drinks alcohol occasionally, maybe once or twice a week and caffeine in the form of coffee, 1 or 2 cups in the mornings.  Jacqueline Barber hydrates well with water, drinks over 64 ounces of water per day.  Jacqueline Barber tries to get enough rest.   Her Past Medical History Is Significant For: Past Medical History:  Diagnosis Date   Anxiety    Follows w/ Dale Maharishi [place], NP @ [facility] in [place].   Depression    Follows w/ Dale [place], NP @ [facility] in [place].   Headache    Hemorrhoid    during pregnancy   Menorrhagia    Migraines    occasional   Postpartum care following vaginal delivery (06/27/12) M 06/28/2012   Vaginal bleeding in pregnant patient at less than [redacted] weeks gestation 08/22/2014   Vaginal Pap smear, abnormal     Her Past Surgical History Is Significant For: Past Surgical History:  Procedure Laterality Date   DILATION AND EVACUATION N/A 05/02/2019   Procedure: DILATATION AND EVACUATION (D&E) 2ND TRIMESTER;  Surgeon: Noland Fordyce, MD;  Location: [facility];  Service: Gynecology;  Laterality: N/A;   IUD REMOVAL N/A 10/05/2022   Procedure: INTRAUTERINE DEVICE (IUD) REMOVAL;  Surgeon: Noland Fordyce, MD;  Location: [facility];  Service: Gynecology;  Laterality: N/A;   OPERATIVE ULTRASOUND N/A 05/02/2019   Procedure: OPERATIVE ULTRASOUND;  Surgeon: Noland Fordyce, MD;  Location: [facility];  Service: Gynecology;  Laterality: N/A;   ROBOTIC ASSISTED LAPAROSCOPIC HYSTERECTOMY AND SALPINGECTOMY Bilateral 10/05/2022   Procedure: XI ROBOTIC ASSISTED LAPAROSCOPIC HYSTERECTOMY AND SALPINGECTOMY;  Surgeon: Noland Fordyce, MD;  Location: [facility];  Service: Gynecology;  Laterality: Bilateral;    Her Family History Is Significant For: Family History  Problem Relation Age of Onset   Hypertension  Mother    Urolithiasis Mother    Hypertension Father    Hyperlipidemia Father    Migraines Father    Urolithiasis Father    Cancer Brother        Leukemia   Other Brother        found tumor on spine age 37..12/25/12   Hypertension Maternal Grandfather    Hypertension Maternal Grandmother    Diabetes Paternal Grandfather     Her Social History Is Significant For: Social History   Socioeconomic History   Marital status: Married    Spouse name: Not on file   Number of children: Not on file   Years of education: Not on file   Highest education level: Not on file  Occupational History   Not on file  Tobacco Use   Smoking status: Former    Packs/day: 0.25    Years: 4.00    Additional pack years: 0.00    Total pack years: 1.00    Types: Cigarettes    Quit date: 10/13/2011    Years since  quitting: 11.1   Smokeless tobacco: Never  Vaping Use   Vaping Use: Never used  Substance and Sexual Activity   Alcohol use: Yes    Alcohol/week: 7.0 standard drinks of alcohol    Types: 4 Glasses of wine, 3 Shots of liquor per week   Drug use: Never   Sexual activity: Yes    Birth control/protection: I.U.D.  Other Topics Concern   Not on file  Social History Narrative   Lives at home, with husband and 3 kids.  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  Education : Scientist, water quality. Caffeine 2 cups coffee..   Social Determinants of Health   Financial Resource Strain: Not on file  Food Insecurity: Not on file  Transportation Needs: Not on file  Physical Activity: Not on file  Stress: Not on file  Social Connections: Not on file    Her Allergies Are:  Allergies  Allergen Reactions   Bactrim [Sulfamethoxazole-Trimethoprim] Hives and Other (See Comments)    High fever   Sulfa Antibiotics Hives and Other (See Comments)    High fever   Amoxicillin Rash    Has patient had a PCN reaction causing immediate rash, facial/tongue/throat swelling, SOB or lightheadedness with hypotension:  Yes Has patient had a PCN reaction causing severe rash involving mucus membranes or skin necrosis: No Has patient had a PCN reaction that required hospitalization No Has patient had a PCN reaction occurring within the last 10 years: No If all of the above answers are "NO", then may proceed with Cephalosporin use.    Cephalexin Rash  :   Her Current Medications Are:  Outpatient Encounter Medications as of 12/19/2022  Medication Sig   amphetamine-dextroamphetamine (ADDERALL) 20 MG tablet Take 20 mg by mouth daily.   FLUoxetine (PROZAC) 40 MG capsule Take 40 mg by mouth daily.   hydrOXYzine (VISTARIL) 25 MG capsule Take 25 mg by mouth at bedtime.   acetaminophen (TYLENOL) 500 MG tablet Take 2 tablets (1,000 mg total) by mouth every 6 (six) hours.   buPROPion (WELLBUTRIN XL) 300 MG 24 hr tablet Take 300 mg by mouth daily.   ibuprofen (ADVIL) 600 MG tablet Take 1 tablet (600 mg total) by mouth every 6 (six) hours.   oxyCODONE (OXY IR/ROXICODONE) 5 MG immediate release tablet Take 1-2 tablets (5-10 mg total) by mouth every 4 (four) hours as needed for moderate pain.   No facility-administered encounter medications on file as of 12/19/2022.  :  Review of Systems:  Out of a complete 14 point review of systems, all are reviewed and negative with the exception of these symptoms as listed below:  Review of Systems  Neurological:        Pt here for migraine f/u  Pt states 1 migraine in last month Pt states sumatriptan was prescribed for migraines and medication worked well Pt states Jacqueline Barber wants to move forward with  MRI for dizziness     Objective:  Neurological Exam  Physical Exam Physical Examination:   Vitals:   12/19/22 0928  BP: 121/83  Pulse: 76    General Examination: The patient is a very pleasant 37 y.o. female in no acute distress. Jacqueline Barber appears well-developed and well-nourished and well groomed.   HEENT: Normocephalic, atraumatic, pupils are equal, round and reactive to light, no  photophobia, tracking well-preserved, face is symmetric with normal facial animation, speech is clear without dysarthria, hypophonia or voice tremor.  Neck is supple with full range of motion, no carotid bruits.  Airway examination  reveals stable findings.  Tongue protrudes centrally and palate elevates symmetrically.     Chest: Clear to auscultation without wheezing, rhonchi or crackles noted.   Heart: S1+S2+0, regular and normal without murmurs, rubs or gallops noted.    Abdomen: Soft, non-tender and non-distended.   Extremities: There is no pitting edema in the distal lower extremities bilaterally.    Skin: Warm and dry without trophic changes noted.    Musculoskeletal: exam reveals no obvious joint deformities.    Neurologically:  Mental status: The patient is awake, alert and oriented in all 4 spheres. Her immediate and remote memory, attention, language skills and fund of knowledge are appropriate. There is no evidence of aphasia, agnosia, apraxia or anomia. Speech is clear with normal prosody and enunciation. Thought process is linear. Mood is normal and affect is normal.  Cranial nerves II - XII are as described above under HEENT exam.  Motor exam: Normal bulk, strength and tone is noted. There is no drift, tremor or rebound. Romberg is negative. Reflexes are 1+ throughout. Babinski: Toes are flexor bilaterally. Fine motor skills and coordination: intact with normal finger taps, normal hand movements, normal rapid alternating patting, normal foot taps and normal foot agility.  Cerebellar testing: No dysmetria or intention tremor on finger to nose testing. Heel to shin is unremarkable bilaterally. There is no truncal or gait ataxia.  Sensory exam: intact to light touch.  Gait, station and balance: Jacqueline Barber stands easily. No veering to one side is noted. No leaning to one side is noted. Posture is age-appropriate and stance is narrow based. Gait shows normal stride length and normal pace. No  problems turning are noted. Tandem walk is unremarkable.   Assessment and Plan:    In summary, AUDREENA BIER is a very pleasant 37 year old female with an underlying medical history of gestational hypertension, anxiety, reflux disease, and overweight state, who presents for follow-up consultation of her intermittent dizziness and intermittent migraines.  Migraines are infrequent thankfully, Jacqueline Barber has had good results with as needed use of sumatriptan hand.  Jacqueline Barber has a prescription through her GYN and is encouraged to use it as prescribed and as needed.  Neurological exam is nonfocal.  Given that Jacqueline Barber has had intermittent dizziness, we will proceed with a brain MRI with and without contrast to rule out a structural cause.  Dizziness may tie-in with anemia, Jacqueline Barber had a recent hysterectomy and feels that her dizziness may be improving.  Jacqueline Barber has had quite significant anemia through chronic blood loss.  At this juncture, so long as her brain MRI shows benign and nonfocal findings, we can play it by ear and follow-up in this clinic as needed.  Jacqueline Barber is advised regarding typical headache triggers today.  I answered all her questions today and the patient was in agreement with the plan.   I spent 30 minutes in total face-to-face time and in reviewing records during pre-charting, more than 50% of which was spent in counseling and coordination of care, reviewing test results, reviewing medications and treatment regimen and/or in discussing or reviewing the diagnosis of intermittent dizziness, migraine headaches, the prognosis and treatment options. Pertinent laboratory and imaging test results that were available during this visit with the patient were reviewed by me and considered in my medical decision making (see chart for details).

## 2022-12-19 NOTE — Patient Instructions (Addendum)
It was nice to see you again.  You look well. You can continue with your sumatriptan as needed for your intermittent migraines through your PCP/Gyn. Anemia may have indeed been part of the cause of your dizziness. You may still improve over the next few weeks and months, as you recent had a hysterectomy.  Please continue to stay well rested and well hydrated.  As discussed I will order a brain MRI with/without contrast to make sure there is no structural cause of your dizziness. So long as your brain MRI shows benign findings, we can see you back as needed.  Please remember, common headache triggers are: sleep deprivation, dehydration, overheating, stress, hypoglycemia or skipping meals and blood sugar fluctuations, excessive pain medications or excessive alcohol use or caffeine withdrawal. Some people have food triggers such as aged cheese, orange juice or chocolate, especially dark chocolate, or MSG (monosodium glutamate). Try to avoid these headache triggers as much possible. It may be helpful to keep a headache diary to figure out what makes your headaches worse or brings them on and what alleviates them. Some people report headache onset after exercise but studies have shown that regular exercise may actually prevent headaches from coming. If you have exercise-induced headaches, please make sure that you drink plenty of fluid before and after exercising and that you do not over do it and do not overheat. Adderall can increase your headaches.

## 2023-02-19 ENCOUNTER — Ambulatory Visit: Payer: BC Managed Care – PPO | Admitting: Neurology

## 2023-04-18 ENCOUNTER — Encounter: Payer: Self-pay | Admitting: Neurology

## 2023-04-18 NOTE — Addendum Note (Signed)
Addended by: Huston Foley on: 04/18/2023 05:28 PM   Modules accepted: Orders

## 2023-05-22 ENCOUNTER — Ambulatory Visit (INDEPENDENT_AMBULATORY_CARE_PROVIDER_SITE_OTHER): Payer: BC Managed Care – PPO

## 2023-05-22 DIAGNOSIS — D500 Iron deficiency anemia secondary to blood loss (chronic): Secondary | ICD-10-CM | POA: Diagnosis not present

## 2023-05-22 DIAGNOSIS — R42 Dizziness and giddiness: Secondary | ICD-10-CM

## 2023-05-22 DIAGNOSIS — G43009 Migraine without aura, not intractable, without status migrainosus: Secondary | ICD-10-CM

## 2023-05-22 MED ORDER — GADOBENATE DIMEGLUMINE 529 MG/ML IV SOLN
15.0000 mL | Freq: Once | INTRAVENOUS | Status: AC | PRN
  Administered 2023-05-22: 15 mL via INTRAVENOUS
